# Patient Record
Sex: Female | Born: 1980 | Race: Black or African American | Hispanic: No | Marital: Single | Smoking: Never smoker
Health system: Southern US, Community
[De-identification: ages and names within clinical notes are randomized; demographics above are authoritative.]

## PROBLEM LIST (undated history)

## (undated) DIAGNOSIS — N858 Other specified noninflammatory disorders of uterus: Secondary | ICD-10-CM

## (undated) HISTORY — PX: HERNIA REPAIR: SHX51

---

## 2016-08-28 ENCOUNTER — Inpatient Hospital Stay (HOSPITAL_COMMUNITY)
Admission: AD | Admit: 2016-08-28 | Discharge: 2016-08-29 | Disposition: A | Discharge status: Home / Self Care | Payer: Self-pay | Source: Ambulatory Visit | Attending: Obstetrics and Gynecology | Admitting: Obstetrics and Gynecology

## 2016-08-28 ENCOUNTER — Encounter (HOSPITAL_COMMUNITY): Payer: Self-pay

## 2016-08-28 DIAGNOSIS — Z3202 Encounter for pregnancy test, result negative: Secondary | ICD-10-CM | POA: Insufficient documentation

## 2016-08-28 DIAGNOSIS — N946 Dysmenorrhea, unspecified: Secondary | ICD-10-CM | POA: Insufficient documentation

## 2016-08-28 DIAGNOSIS — N939 Abnormal uterine and vaginal bleeding, unspecified: Secondary | ICD-10-CM | POA: Insufficient documentation

## 2016-08-28 LAB — WET PREP, GENITAL
CLUE CELLS WET PREP: NONE SEEN
Sperm: NONE SEEN
Trich, Wet Prep: NONE SEEN
YEAST WET PREP: NONE SEEN

## 2016-08-28 LAB — CBC
HCT: 35.1 % — ABNORMAL LOW (ref 36.0–46.0)
Hemoglobin: 11.7 g/dL — ABNORMAL LOW (ref 12.0–15.0)
MCH: 29.8 pg (ref 26.0–34.0)
MCHC: 33.3 g/dL (ref 30.0–36.0)
MCV: 89.5 fL (ref 78.0–100.0)
PLATELETS: 215 10*3/uL (ref 150–400)
RBC: 3.92 MIL/uL (ref 3.87–5.11)
RDW: 13.7 % (ref 11.5–15.5)
WBC: 7.6 10*3/uL (ref 4.0–10.5)

## 2016-08-28 LAB — URINALYSIS, ROUTINE W REFLEX MICROSCOPIC
BACTERIA UA: NONE SEEN
Bilirubin Urine: NEGATIVE
Glucose, UA: NEGATIVE mg/dL
KETONES UR: NEGATIVE mg/dL
LEUKOCYTES UA: NEGATIVE
Nitrite: NEGATIVE
PH: 6 (ref 5.0–8.0)
Protein, ur: NEGATIVE mg/dL
Specific Gravity, Urine: 1.005 (ref 1.005–1.030)

## 2016-08-28 LAB — POCT PREGNANCY, URINE: PREG TEST UR: NEGATIVE

## 2016-08-28 MED ORDER — KETOROLAC TROMETHAMINE 60 MG/2ML IM SOLN
60.0000 mg | INTRAMUSCULAR | Status: AC
  Administered 2016-08-28: 60 mg via INTRAMUSCULAR
  Filled 2016-08-28: qty 2

## 2016-08-28 MED ORDER — MEGESTROL ACETATE 20 MG PO TABS: ORAL_TABLET | ORAL | 0 refills | Status: DC

## 2016-08-28 NOTE — MAU Note (Signed)
Patient reports lower abdominal pain and lower back pain.  No period for 3 months.  Bleeding beginning 2 days ago and reports passing a few medium sized blood clots.  Has not taken a HPT.  Reports feeling "like she is pregnant," reporting symptoms of nausea, palpitations, headaches,  lethargy & loss of appetite.  Last intercourse February 6th.

## 2016-08-28 NOTE — MAU Provider Note (Signed)
Chief Complaint: Possible Pregnancy; Vaginal Bleeding; and Abdominal Pain   First Provider Initiated Contact with Patient 08/28/16 2247      SUBJECTIVE HPI: Tiffany Daugherty is a 36 y.o. G2P1010 presents to maternity admissions reporting possible pregnancy with recent intermittent symptoms of fatigue, nausea, and h/a then onset of abdominal pain and vaginal bleeding 2 days ago. She has not taken a pregnancy test but reports last menses 3 months ago. With her symptoms and late menses, she believes she is pregnant.  She usually has regular menses.  Her pain started first with intermittent lower abdominal cramping, then onset of vaginal bleeding that was at first light, then heavier with clots the following day. She reports changing her pad every 2 hours today, soaking the pad with clots each time.  She has not tried any treatments. Nothing makes the pain or bleeding better.  She recently moved from [place] and thinks the move is making her fatigue and nausea worse.  There are no other associated symptoms. She denies vaginal itching/burning, urinary symptoms, h/a, dizziness, vomiting, or fever/chills.     HPI  Past Medical History:  Diagnosis Date  . Medical history non-contributory    Past Surgical History:  Procedure Laterality Date  . NO PAST SURGERIES     Social History   Social History  . Marital status: Single    Spouse name: N/A  . Number of children: N/A  . Years of education: N/A   Occupational History  . Not on file.   Social History Main Topics  . Smoking status: Not on file  . Smokeless tobacco: Not on file  . Alcohol use Not on file  . Drug use: Unknown  . Sexual activity: Not Currently   Other Topics Concern  . Not on file   Social History Narrative  . No narrative on file   No current facility-administered medications on file prior to encounter.    No current outpatient prescriptions on file prior to encounter.   No Known Allergies  ROS:  Review of Systems   Constitutional: Negative for chills, fatigue and fever.  Respiratory: Negative for shortness of breath.   Cardiovascular: Negative for chest pain.  Gastrointestinal: Positive for nausea. Negative for vomiting.  Genitourinary: Positive for pelvic pain and vaginal bleeding. Negative for difficulty urinating, dysuria, flank pain, vaginal discharge and vaginal pain.  Neurological: Negative for dizziness and headaches.  Psychiatric/Behavioral: Negative.      I have reviewed patient's Past Medical Hx, Surgical Hx, Family Hx, Social Hx, medications and allergies.   Physical Exam   Patient Vitals for the past 24 hrs:  BP Temp Temp src Pulse Resp Height  08/29/16 0007 115/69 - - 82 19 -  08/28/16 2229 124/84 98.1 F (36.7 C) Oral 89 19 5' 4.96" (1.65 m)   Constitutional: Well-developed, well-nourished female in no acute distress.  Cardiovascular: normal rate Respiratory: normal effort GI: Abd soft, non-tender. Pos BS x 4 MS: Extremities nontender, no edema, normal ROM Neurologic: Alert and oriented x 4.  GU: Neg CVAT.  PELVIC EXAM: Cervix pink, visually closed, without lesion, moderate amount dark red bleeding without clots, vaginal walls and external genitalia normal Bimanual exam: Cervix 0/long/high, firm, anterior, neg CMT, uterus tender, adnexa without tenderness, enlargement, or mass, technically difficult to palpate uterus and adnexa r/t body habitus   LAB RESULTS Results for orders placed or performed during the hospital encounter of 08/28/16 (from the past 24 hour(s))  Urinalysis, Routine w reflex microscopic     Status:  Abnormal   Collection Time: 08/28/16  9:50 PM  Result Value Ref Range   Color, Urine STRAW (A) YELLOW   APPearance CLEAR CLEAR   Specific Gravity, Urine 1.005 1.005 - 1.030   pH 6.0 5.0 - 8.0   Glucose, UA NEGATIVE NEGATIVE mg/dL   Hgb urine dipstick LARGE (A) NEGATIVE   Bilirubin Urine NEGATIVE NEGATIVE   Ketones, ur NEGATIVE NEGATIVE mg/dL   Protein,  ur NEGATIVE NEGATIVE mg/dL   Nitrite NEGATIVE NEGATIVE   Leukocytes, UA NEGATIVE NEGATIVE   RBC / HPF TOO NUMEROUS TO COUNT 0 - 5 RBC/hpf   WBC, UA 0-5 0 - 5 WBC/hpf   Bacteria, UA NONE SEEN NONE SEEN   Squamous Epithelial / LPF 0-5 (A) NONE SEEN  Pregnancy, urine POC     Status: None   Collection Time: 08/28/16  9:59 PM  Result Value Ref Range   Preg Test, Ur NEGATIVE NEGATIVE  Wet prep, genital     Status: Abnormal   Collection Time: 08/28/16 11:00 PM  Result Value Ref Range   Yeast Wet Prep HPF POC NONE SEEN NONE SEEN   Trich, Wet Prep NONE SEEN NONE SEEN   Clue Cells Wet Prep HPF POC NONE SEEN NONE SEEN   WBC, Wet Prep HPF POC FEW (A) NONE SEEN   Sperm NONE SEEN   CBC     Status: Abnormal   Collection Time: 08/28/16 11:13 PM  Result Value Ref Range   WBC 7.6 4.0 - 10.5 K/uL   RBC 3.92 3.87 - 5.11 MIL/uL   Hemoglobin 11.7 (L) 12.0 - 15.0 g/dL   HCT 35.1 (L) 36.0 - 46.0 %   MCV 89.5 78.0 - 100.0 fL   MCH 29.8 26.0 - 34.0 pg   MCHC 33.3 30.0 - 36.0 g/dL   RDW 13.7 11.5 - 15.5 %   Platelets 215 150 - 400 K/uL       IMAGING No results found.  MAU Management/MDM: Ordered labs (UPT, UA, CBC) and reviewed results.  Hgb low normal, pregnancy test negative.  Likely AUB associated with dysmenorrhea.  F/U with outpatient [place] and appointment in [facility].  Toradol 60 mg IM x 1 dose given in MAU.  Rx for Megace 40 mg daily to use if bleeding persists or remains heavy.  Pt stable at time of discharge.  ASSESSMENT 1. Abnormal uterine bleeding (AUB)     PLAN Discharge home with bleeding precautions  Allergies as of 08/29/2016   No Known Allergies     Medication List    TAKE these medications   megestrol 20 MG tablet Commonly known as:  MEGACE Take 4 tablets on Day 1, then 2 tablets/day for 2 weeks to 1 month. Prendre 4 comprims le jour 1, puis 2 comprims / jour pendant 2 semaines  1 mois.      Follow-up [facility] for [place] Follow up.    Specialty:  Obstetrics and Gynecology Why:  Marin Comment bureau vous appellera avec un rendez-vous. Retourner  MAU au besoin pour les urgences Contact information: [place] Certified Nurse-Midwife 08/29/2016  3:30 AM

## 2016-08-28 NOTE — Discharge Instructions (Signed)
Saignement utrin anormal Les saignements utrins anormaux peuvent affecter les femmes  diffrents stades de Mining engineer, y compris les adolescents, les femmes en ge de Dispensing optician, les femmes enceintes et les femmes mnopauses. Plusieurs types de saignements utrins sont considrs [employer], y compris: Saignement ou spotting entre les priodes. Saignement aprs un rapport sexuel. Saignement qui est plus lourd ou plus que la normale. Priodes qui durent plus longtemps que d'habitude. Saignement aprs la mnopause. De nombreux cas de saignements utrins anormaux sont mineurs et simples  traiter, tandis que [employer] plus graves. Tout type de saignement anormal devrait tre valu par votre fournisseur de soins de sant. Le traitement dpendra de la cause du saignement. Noreene Larsson ces instructions  la maison: Occupational psychologist votre condition pour [employer]. Les actions suivantes Architectural technologist toute gne que vous prouvez: vitez l'utilisation de tampons et douches comme indiqu par votre fournisseur de soins de sant. Changez vos pads frquemment. Vous devriez obtenir des examens pelviens rguliers et des tests Pap. Gardez tous les rendez-vous de suivi pour les tests de diagnostic comme indiqu par [employer] fournisseur de soins de sant. Contactez un fournisseur de soins de sant si: Votre saignement dure plus d'une semaine. Vous vous sentez tourdi par moments. Fae Pippin de l'aide immdiatement si: Vous vous vanouissez. Vous changez de serviettes toutes les 15  30 minutes. Vous avez des [employer]. Tu as de TEFL teacher. Vous devenez [facility]. Vous passez de gros caillots de sang du vagin. Vous commencez  vous sentir nauseux et vomissez. Cette information n'est pas destine  remplacer les conseils donns par votre fournisseur de soins de sant. Assurez-vous de [facility] questions que vous Art therapist votre fournisseur de soins de sant.   Abnormal Uterine  Bleeding Abnormal uterine bleeding can affect women at various stages in life, including teenagers, women in their reproductive years, pregnant women, and women who have reached menopause. Several kinds of uterine bleeding are considered abnormal, including:  Bleeding or spotting between periods.  Bleeding after sexual intercourse.  Bleeding that is heavier or more than normal.  Periods that last longer than usual.  Bleeding after menopause. Many cases of abnormal uterine bleeding are minor and simple to treat, while others are more serious. Any type of abnormal bleeding should be evaluated by your health care provider. Treatment will depend on the cause of the bleeding. Follow these instructions at home: Monitor your condition for any changes. The following actions may help to alleviate any discomfort you are experiencing:  Avoid the use of tampons and douches as directed by your health care provider.  Change your pads frequently. You should get regular pelvic exams and Pap tests. Keep all follow-up appointments for diagnostic tests as directed by your health care provider. Contact a health care provider if:  Your bleeding lasts more than 1 week.  You feel dizzy at times. Get help right away if:  You pass out.  You are changing pads every 15 to 30 minutes.  You have abdominal pain.  You have a fever.  You become sweaty or weak.  You are passing large blood clots from the vagina.  You start to feel nauseous and vomit. This information is not intended to replace advice given to you by your health care provider. Make sure you discuss any questions you have with your health care provider. Document Released: 04/30/2005 Document Revised: 10/12/2015 Document Reviewed: 11/27/2012 Elsevier Interactive Patient Education  2017 [employer].

## 2016-08-30 LAB — GC/CHLAMYDIA PROBE AMP (~~LOC~~) NOT AT ARMC
Chlamydia: NEGATIVE
NEISSERIA GONORRHEA: NEGATIVE

## 2016-09-14 ENCOUNTER — Ambulatory Visit (HOSPITAL_COMMUNITY)
Admission: RE | Admit: 2016-09-14 | Discharge: 2016-09-14 | Disposition: A | Discharge status: Home / Self Care | Payer: Self-pay | Source: Ambulatory Visit | Attending: Advanced Practice Midwife | Admitting: Advanced Practice Midwife

## 2016-09-14 DIAGNOSIS — D251 Intramural leiomyoma of uterus: Secondary | ICD-10-CM | POA: Insufficient documentation

## 2016-09-14 DIAGNOSIS — N939 Abnormal uterine and vaginal bleeding, unspecified: Secondary | ICD-10-CM | POA: Insufficient documentation

## 2016-10-02 ENCOUNTER — Encounter: Payer: Self-pay | Admitting: Obstetrics & Gynecology

## 2017-01-09 ENCOUNTER — Encounter (HOSPITAL_COMMUNITY): Payer: Self-pay | Admitting: Emergency Medicine

## 2017-01-09 ENCOUNTER — Ambulatory Visit (HOSPITAL_COMMUNITY)
Admission: EM | Admit: 2017-01-09 | Discharge: 2017-01-09 | Disposition: A | Discharge status: Home / Self Care | Payer: Self-pay | Attending: Family Medicine | Admitting: Family Medicine

## 2017-01-09 DIAGNOSIS — M7918 Myalgia, other site: Secondary | ICD-10-CM

## 2017-01-09 DIAGNOSIS — M791 Myalgia: Secondary | ICD-10-CM

## 2017-01-09 MED ORDER — DICLOFENAC SODIUM 75 MG PO TBEC: 75.0000 mg | DELAYED_RELEASE_TABLET | Freq: Two times a day (BID) | ORAL | 0 refills | Status: DC

## 2017-01-09 MED ORDER — CYCLOBENZAPRINE HCL 10 MG PO TABS: 10.0000 mg | ORAL_TABLET | Freq: Two times a day (BID) | ORAL | 0 refills | Status: DC | PRN
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
## 2017-07-29 ENCOUNTER — Ambulatory Visit (INDEPENDENT_AMBULATORY_CARE_PROVIDER_SITE_OTHER): Payer: Self-pay

## 2017-07-29 ENCOUNTER — Ambulatory Visit (HOSPITAL_COMMUNITY)
Admission: EM | Admit: 2017-07-29 | Discharge: 2017-07-29 | Disposition: A | Discharge status: Home / Self Care | Payer: Self-pay | Attending: Internal Medicine | Admitting: Internal Medicine

## 2017-07-29 ENCOUNTER — Encounter (HOSPITAL_COMMUNITY): Payer: Self-pay | Admitting: Emergency Medicine

## 2017-07-29 DIAGNOSIS — M545 Low back pain, unspecified: Secondary | ICD-10-CM

## 2017-07-29 DIAGNOSIS — G44209 Tension-type headache, unspecified, not intractable: Secondary | ICD-10-CM

## 2017-07-29 LAB — POCT URINALYSIS DIP (DEVICE)
Bilirubin Urine: NEGATIVE
GLUCOSE, UA: NEGATIVE mg/dL
Ketones, ur: NEGATIVE mg/dL
LEUKOCYTES UA: NEGATIVE
Nitrite: NEGATIVE
PROTEIN: NEGATIVE mg/dL
Specific Gravity, Urine: 1.025 (ref 1.005–1.030)
UROBILINOGEN UA: 0.2 mg/dL (ref 0.0–1.0)
pH: 5.5 (ref 5.0–8.0)

## 2017-07-29 MED ORDER — MELOXICAM 7.5 MG PO TABS: 7.5000 mg | ORAL_TABLET | Freq: Every day | ORAL | 0 refills | Status: DC

## 2017-07-29 MED ORDER — CYCLOBENZAPRINE HCL 10 MG PO TABS: 10.0000 mg | ORAL_TABLET | Freq: Every evening | ORAL | 0 refills | Status: DC | PRN

## 2017-07-29 NOTE — ED Triage Notes (Signed)
Pt here for frontal HA and lower back pain x 10 days; back worse with standing

## 2017-07-29 NOTE — ED Provider Notes (Signed)
  MRN: [id number] DOB: [birth year]  Subjective:   Tiffany Daugherty is a 37 y.o. female presenting for 10 day history of low back pain, R>L. Pain is cramp type sensation, does not radiate, feels warmth, has difficulty walking. Has also had a headache for the same amount. Headache is constant, frontal, improved with APAP, worse in the morning and at night. Has associated nausea without vomiting, occasional subjective fever. Denies falls, trauma, dysuria, urinary frequency, hematuria, sinus congestion, sinus pain, sore throat. Denies smoking cigarettes.  Tiffany Daugherty has No Known Allergies.  Tiffany Daugherty has pmh of external hemorrhoids, psh of hemorrhoidectomy.  Objective:   Vitals: BP (!) 146/91 (BP Location: Right Arm)   Pulse 87   Temp (!) 97.5 F (36.4 C) (Oral)   Resp 18   SpO2 100%   Physical Exam  Constitutional: She is oriented to person, place, and time. She appears well-developed and well-nourished.  HENT:  Mouth/Throat: Oropharynx is clear and moist.  Eyes: EOM are normal. Pupils are equal, round, and reactive to light. Right eye exhibits no discharge. Left eye exhibits no discharge.  Neck: Normal range of motion. Neck supple.  Cardiovascular: Normal rate, regular rhythm and intact distal pulses. Exam reveals no gallop and no friction rub.  No murmur heard. Pulmonary/Chest: Effort normal. No respiratory distress. She has no wheezes. She has no rales.  Neurological: She is alert and oriented to person, place, and time. She displays normal reflexes. Coordination (moving gingerly favoring her low back) abnormal.  Skin: Skin is warm and dry.  Psychiatric: She has a normal mood and affect.   Results for orders placed or performed during the hospital encounter of 07/29/17 (from the past 24 hour(s))  POCT urinalysis dip (device)     Status: Abnormal   Collection Time: 07/29/17 12:11 PM  Result Value Ref Range   Glucose, UA NEGATIVE NEGATIVE mg/dL   Bilirubin Urine NEGATIVE NEGATIVE   Ketones, ur  NEGATIVE NEGATIVE mg/dL   Specific Gravity, Urine 1.025 1.005 - 1.030   Hgb urine dipstick LARGE (A) NEGATIVE   pH 5.5 5.0 - 8.0   Protein, ur NEGATIVE NEGATIVE mg/dL   Urobilinogen, UA 0.2 0.0 - 1.0 mg/dL   Nitrite NEGATIVE NEGATIVE   Leukocytes, UA NEGATIVE NEGATIVE   Dg Lumbar Spine Complete  Result Date: 07/29/2017 CLINICAL DATA:  Two weeks of low back pain.  No known injury EXAM: LUMBAR SPINE - COMPLETE 4+ VIEW COMPARISON:  None in PACs FINDINGS: The lumbar vertebral bodies are preserved in height. The disc space heights are well maintained. There is no spondylolisthesis. There is no significant facet joint hypertrophy. The pedicles and transverse processes are intact. The observed portions of the sacrum are normal. IMPRESSION: There is no acute or significant chronic bony abnormality of the lumbar spine. Who hole interest Electronically Signed   By: David  Martinique M.D.   On: 07/29/2017 12:29   Assessment and Plan :   Acute non intractable tension-type headache  Acute low back pain without sciatica, unspecified back pain laterality  Physical exam findings reassuring. Will start conservative management. Use meloxicam, hydrate well. Return-to-clinic precautions discussed, patient verbalized understanding.    Jaynee Eagles, PA-C 07/29/17 2257

## 2017-09-26 ENCOUNTER — Ambulatory Visit: Payer: Self-pay | Admitting: Obstetrics & Gynecology

## 2017-09-26 ENCOUNTER — Telehealth: Payer: Self-pay | Admitting: *Deleted

## 2017-09-26 ENCOUNTER — Other Ambulatory Visit: Payer: Self-pay

## 2017-09-26 ENCOUNTER — Encounter: Payer: Self-pay | Admitting: Obstetrics & Gynecology

## 2017-09-26 VITALS — BP 147/81 | HR 90 | Wt 299.6 lb

## 2017-09-26 DIAGNOSIS — Z3202 Encounter for pregnancy test, result negative: Secondary | ICD-10-CM

## 2017-09-26 DIAGNOSIS — N912 Amenorrhea, unspecified: Secondary | ICD-10-CM

## 2017-09-26 LAB — POCT PREGNANCY, URINE: Preg Test, Ur: NEGATIVE

## 2017-09-26 NOTE — Telephone Encounter (Signed)
Called pt's sister at the pt's request. Left message on voice mail. U/s appt scheduled 5/21 @ 0930. Please arrive 15 min early and have a full bladder. If any questions she may call the clinic.

## 2017-09-26 NOTE — Progress Notes (Signed)
[place] interpreter [phone]

## 2017-09-26 NOTE — Progress Notes (Signed)
Patient ID: Tiffany Daugherty, female   DOB: [birth year], 37 y.o.   MRN: [id number]  Chief Complaint  Patient presents with  . Amenorrhea    HPI Tiffany Daugherty is a 37 y.o. female.  Single African P1 (71 yo son) here today because she has not had a period in a year. She denies hot flashes. She had sex last about 2 months ago. She uses condoms.  HPI  Past Medical History:  Diagnosis Date  . Medical history non-contributory     Past Surgical History:  Procedure Laterality Date  . NO PAST SURGERIES      History reviewed. No pertinent family history.  Social History Social History   Tobacco Use  . Smoking status: Never Smoker  . Smokeless tobacco: Never Used  Substance Use Topics  . Alcohol use: Yes  . Drug use: No    No Known Allergies  Current Outpatient Medications  Medication Sig Dispense Refill  . cyclobenzaprine (FLEXERIL) 10 MG tablet Take 1 tablet (10 mg total) by mouth at bedtime as needed for muscle spasms. (Patient not taking: Reported on 09/26/2017) 30 tablet 0  . meloxicam (MOBIC) 7.5 MG tablet Take 1 tablet (7.5 mg total) by mouth daily. (Patient not taking: Reported on 09/26/2017) 30 tablet 0   No current facility-administered medications for this visit.     Review of Systems Review of Systems  Blood pressure (!) 147/81, pulse 90, weight 299 lb 9.6 oz (135.9 kg), last menstrual period 08/27/2016.  Physical Exam Physical Exam Breathing, conversing, and ambulating normally Morbidly obese Black female, no apparent distress  Data Reviewed Normal u/s 5/18  Assessment    Preventative care   Amenorrhea Plan    She was given the number for the [facility]. Check TSH, FSH, prolactin, and gyn u/s       Emily Filbert 09/26/2017, 9:43 AM

## 2017-09-27 LAB — TSH: TSH: 4.07 u[IU]/mL (ref 0.450–4.500)

## 2017-09-27 LAB — PROLACTIN: Prolactin: 12.5 ng/mL (ref 4.8–23.3)

## 2017-09-27 LAB — FOLLICLE STIMULATING HORMONE: FSH: 4.7 m[IU]/mL

## 2017-10-01 ENCOUNTER — Ambulatory Visit (HOSPITAL_COMMUNITY): Admission: RE | Admit: 2017-10-01 | Payer: Self-pay | Source: Ambulatory Visit

## 2017-10-10 ENCOUNTER — Other Ambulatory Visit: Payer: Self-pay | Admitting: Obstetrics & Gynecology

## 2017-10-16 ENCOUNTER — Encounter (HOSPITAL_COMMUNITY): Payer: Self-pay

## 2017-10-16 ENCOUNTER — Inpatient Hospital Stay (HOSPITAL_COMMUNITY)
Admission: AD | Admit: 2017-10-16 | Discharge: 2017-10-16 | Disposition: A | Discharge status: Home / Self Care | Payer: Self-pay | Source: Ambulatory Visit | Attending: Obstetrics & Gynecology | Admitting: Obstetrics & Gynecology

## 2017-10-16 ENCOUNTER — Ambulatory Visit (HOSPITAL_COMMUNITY): Admission: RE | Admit: 2017-10-16 | Payer: Self-pay | Source: Ambulatory Visit

## 2017-10-16 ENCOUNTER — Inpatient Hospital Stay (HOSPITAL_COMMUNITY): Payer: Self-pay

## 2017-10-16 ENCOUNTER — Other Ambulatory Visit: Payer: Self-pay

## 2017-10-16 DIAGNOSIS — N730 Acute parametritis and pelvic cellulitis: Secondary | ICD-10-CM

## 2017-10-16 DIAGNOSIS — R102 Pelvic and perineal pain: Secondary | ICD-10-CM

## 2017-10-16 HISTORY — DX: Other specified noninflammatory disorders of uterus: N85.8

## 2017-10-16 LAB — CBC WITH DIFFERENTIAL/PLATELET
Basophils Absolute: 0 10*3/uL (ref 0.0–0.1)
Basophils Relative: 0 %
Eosinophils Absolute: 0.1 10*3/uL (ref 0.0–0.7)
Eosinophils Relative: 1 %
HCT: 36.3 % (ref 36.0–46.0)
HEMOGLOBIN: 12.4 g/dL (ref 12.0–15.0)
LYMPHS ABS: 2.4 10*3/uL (ref 0.7–4.0)
LYMPHS PCT: 42 %
MCH: 30 pg (ref 26.0–34.0)
MCHC: 34.2 g/dL (ref 30.0–36.0)
MCV: 87.9 fL (ref 78.0–100.0)
Monocytes Absolute: 0.2 10*3/uL (ref 0.1–1.0)
Monocytes Relative: 4 %
NEUTROS ABS: 3 10*3/uL (ref 1.7–7.7)
NEUTROS PCT: 53 %
Platelets: 211 10*3/uL (ref 150–400)
RBC: 4.13 MIL/uL (ref 3.87–5.11)
RDW: 14.1 % (ref 11.5–15.5)
WBC: 5.7 10*3/uL (ref 4.0–10.5)

## 2017-10-16 LAB — COMPREHENSIVE METABOLIC PANEL
ALK PHOS: 58 U/L (ref 38–126)
ALT: 21 U/L (ref 14–54)
AST: 19 U/L (ref 15–41)
Albumin: 3.8 g/dL (ref 3.5–5.0)
Anion gap: 10 (ref 5–15)
BUN: 9 mg/dL (ref 6–20)
CALCIUM: 8.8 mg/dL — AB (ref 8.9–10.3)
CO2: 24 mmol/L (ref 22–32)
CREATININE: 0.88 mg/dL (ref 0.44–1.00)
Chloride: 103 mmol/L (ref 101–111)
GFR calc Af Amer: >60 mL/min (ref >60)
GFR calc non Af Amer: >60 mL/min (ref >60)
GLUCOSE: 109 mg/dL — AB (ref 65–99)
Potassium: 4 mmol/L (ref 3.5–5.1)
Sodium: 137 mmol/L (ref 135–145)
Total Bilirubin: 0.4 mg/dL (ref 0.3–1.2)
Total Protein: 8 g/dL (ref 6.5–8.1)

## 2017-10-16 LAB — POCT PREGNANCY, URINE: Preg Test, Ur: NEGATIVE

## 2017-10-16 LAB — URINALYSIS, ROUTINE W REFLEX MICROSCOPIC
Bilirubin Urine: NEGATIVE
Glucose, UA: NEGATIVE mg/dL
Hgb urine dipstick: NEGATIVE
Ketones, ur: NEGATIVE mg/dL
LEUKOCYTES UA: NEGATIVE
NITRITE: NEGATIVE
Protein, ur: NEGATIVE mg/dL
SPECIFIC GRAVITY, URINE: 1.025 (ref 1.005–1.030)
pH: 7 (ref 5.0–8.0)

## 2017-10-16 LAB — WET PREP, GENITAL
CLUE CELLS WET PREP: NONE SEEN
SPERM: NONE SEEN
TRICH WET PREP: NONE SEEN

## 2017-10-16 LAB — AMYLASE: Amylase: 97 U/L (ref 28–100)

## 2017-10-16 LAB — LIPASE, BLOOD: LIPASE: 25 U/L (ref 11–51)

## 2017-10-16 MED ORDER — TRAMADOL HCL 50 MG PO TABS: 100.0000 mg | ORAL_TABLET | Freq: Four times a day (QID) | ORAL | 0 refills | Status: DC | PRN

## 2017-10-16 MED ORDER — AZITHROMYCIN 250 MG PO TABS: 1000.0000 mg | ORAL_TABLET | Freq: Once | ORAL | 0 refills | Status: AC

## 2017-10-16 MED ORDER — KETOROLAC TROMETHAMINE 60 MG/2ML IM SOLN
60.0000 mg | Freq: Once | INTRAMUSCULAR | Status: AC
Start: 2017-10-16 — End: 2017-10-16
  Administered 2017-10-16: 60 mg via INTRAMUSCULAR
  Filled 2017-10-16: qty 2

## 2017-10-16 MED ORDER — CEFTRIAXONE SODIUM 250 MG IJ SOLR
250.0000 mg | Freq: Once | INTRAMUSCULAR | Status: AC
  Administered 2017-10-16: 250 mg via INTRAMUSCULAR
  Filled 2017-10-16: qty 250

## 2017-10-16 MED ORDER — AZITHROMYCIN 250 MG PO TABS
1000.0000 mg | ORAL_TABLET | Freq: Once | ORAL | Status: AC
  Administered 2017-10-16: 1000 mg via ORAL
  Filled 2017-10-16: qty 4

## 2017-10-16 NOTE — Progress Notes (Addendum)
Use of stratus to communicate with pt. # [id number] Michaelle  Non pregn here for abd pain on the right side for past few days and getting worse especially last night. Took french medication (anti-inflammatory). States helped "one hour only". Had this same pain a year ago and was given pain med that helped.   States pain exacerbates during monthly menses.   Pt has an U/S appt at 1300 today.   Encouraged pt to obtain a primary doctor to manage her ongoing abd pain. Pt verbalized understanding.   1210: provider at bs assessing. Pelvic exam, wetprep, and GC done.   1222: Medicated per order.   1230: Lab at bs  1450: provider at bs reassessing with use of stratus. Betty [id number]  1500: medicated per order.   1509: D/c instructions given with pt to follow up with Dr. Hulan Fray.

## 2017-10-16 NOTE — Discharge Instructions (Signed)
Pelvic Pain, Female °Pelvic pain is pain in your lower belly (abdomen), below your belly button and between your hips. The pain may start suddenly (acute), keep coming back (recurring), or last a long time (chronic). Pelvic pain that lasts longer than six months is considered chronic. There are many causes of pelvic pain. Sometimes the cause of your pelvic pain is not known. °Follow these instructions at home: °· Take over-the-counter and prescription medicines only as told by your doctor. °· Rest as told by your doctor. °· Do not have sex it if hurts. °· Keep a journal of your pelvic pain. Write down: °? When the pain started. °? Where the pain is located. °? What seems to make the pain better or worse, such as food or your menstrual cycle. °? Any symptoms you have along with the pain. °· Keep all follow-up visits as told by your doctor. This is important. °Contact a doctor if: °· Medicine does not help your pain. °· Your pain comes back. °· You have new symptoms. °· You have unusual vaginal discharge or bleeding. °· You have a fever or chills. °· You are having a hard time pooping (constipation). °· You have blood in your pee (urine) or poop (stool). °· Your pee smells bad. °· You feel weak or lightheaded. °Get help right away if: °· You have sudden pain that is very bad. °· Your pain continues to get worse. °· You have very bad pain and also have any of the following symptoms: °? A fever. °? Feeling stick to your stomach (nausea). °? Throwing up (vomiting). °? Being very sweaty. °· You pass out (lose consciousness). °This information is not intended to replace advice given to you by your health care provider. Make sure you discuss any questions you have with your health care provider. °Document Released: 10/17/2007 Document Revised: 05/25/2015 Document Reviewed: 02/18/2015 °Elsevier Interactive Patient Education © 2018 Elsevier Inc. ° °

## 2017-10-16 NOTE — MAU Provider Note (Signed)
History     CSN: [id number]  Arrival date and time: 10/16/17 1032   First Provider Initiated Contact with Patient 10/16/17 1202     [facility] #[id number]  Chief Complaint  Patient presents with  . Pelvic Pain   Tiffany Daugherty is a 37 y.o. G2P1011 who presents today with pelvic pain. She reports that has been an ongoing issue with her. She is scheduled for a pelvic US today, but the pain was too great. She came here to be seen.   Pelvic Pain  The patient's primary symptoms include pelvic pain. The patient's pertinent negatives include no vaginal discharge. This is a new problem. Episode onset: 10/14/17. The problem occurs constantly. The problem has been gradually worsening. Pain severity now: 10/10. The problem affects both sides. She is not pregnant. Pertinent negatives include no chills, dysuria, fever, frequency, nausea, urgency or vomiting. The vaginal discharge was normal. There has been no bleeding. Nothing aggravates the symptoms. Treatments tried: diclofenac  The treatment provided no relief. She is not sexually active. She uses nothing for contraception. Her menstrual history has been irregular (LMP, 08/27/2016 has seen clinic for amenorrhea).   Past Medical History:  Diagnosis Date  . Uterine cyst     Past Surgical History:  Procedure Laterality Date  . HERNIA REPAIR      No family history on file.  Social History   Tobacco Use  . Smoking status: Never Smoker  . Smokeless tobacco: Never Used  Substance Use Topics  . Alcohol use: Yes  . Drug use: No    Allergies: No Known Allergies  Medications Prior to Admission  Medication Sig Dispense Refill Last Dose  . cyclobenzaprine (FLEXERIL) 10 MG tablet Take 1 tablet (10 mg total) by mouth at bedtime as needed for muscle spasms. (Patient not taking: Reported on 09/26/2017) 30 tablet 0 Not Taking  . meloxicam (MOBIC) 7.5 MG tablet Take 1 tablet (7.5 mg total) by mouth daily. (Patient not taking: Reported on 09/26/2017) 30  tablet 0 Not Taking    Review of Systems  Constitutional: Negative for chills and fever.  Gastrointestinal: Negative for nausea and vomiting.  Genitourinary: Positive for pelvic pain. Negative for dysuria, frequency, urgency, vaginal bleeding and vaginal discharge.   Physical Exam   Blood pressure 122/86, pulse 81, temperature 98.6 F (37 C), temperature source Oral, resp. rate 20, height 5\' 4"  (1.626 m), weight (!) 304 lb (137.9 kg), SpO2 98 %.  Physical Exam  Nursing note and vitals reviewed. Constitutional: She is oriented to person, place, and time. She appears well-developed. No distress.  HENT:  Head: Normocephalic.  Cardiovascular: Normal rate.  Respiratory: Effort normal.  GI: Soft. There is no tenderness. There is no rebound.  Genitourinary:  Genitourinary Comments:  External: no lesion Vagina: small amount of white discharge Cervix: pink, smooth, + CMT Uterus: NSSC Adnexa: NT   Neurological: She is alert and oriented to person, place, and time.  Skin: Skin is warm and dry.  Psychiatric: She has a normal mood and affect.     Results for orders placed or performed during the hospital encounter of 10/16/17 (from the past 24 hour(s))  Urinalysis, Routine w reflex microscopic     Status: None   Collection Time: 10/16/17 11:06 AM  Result Value Ref Range   Color, Urine YELLOW YELLOW   APPearance CLEAR CLEAR   Specific Gravity, Urine 1.025 1.005 - 1.030   pH 7.0 5.0 - 8.0   Glucose, UA NEGATIVE NEGATIVE mg/dL   Hgb  urine dipstick NEGATIVE NEGATIVE   Bilirubin Urine NEGATIVE NEGATIVE   Ketones, ur NEGATIVE NEGATIVE mg/dL   Protein, ur NEGATIVE NEGATIVE mg/dL   Nitrite NEGATIVE NEGATIVE   Leukocytes, UA NEGATIVE NEGATIVE  Pregnancy, urine POC     Status: None   Collection Time: 10/16/17 11:15 AM  Result Value Ref Range   Preg Test, Ur NEGATIVE NEGATIVE  Wet prep, genital     Status: Abnormal   Collection Time: 10/16/17 12:03 PM  Result Value Ref Range   Yeast  Wet Prep HPF POC PRESENT (A) NONE SEEN   Trich, Wet Prep NONE SEEN NONE SEEN   Clue Cells Wet Prep HPF POC NONE SEEN NONE SEEN   WBC, Wet Prep HPF POC FEW (A) NONE SEEN   Sperm NONE SEEN   CBC with Differential/Platelet     Status: None   Collection Time: 10/16/17 12:26 PM  Result Value Ref Range   WBC 5.7 4.0 - 10.5 K/uL   RBC 4.13 3.87 - 5.11 MIL/uL   Hemoglobin 12.4 12.0 - 15.0 g/dL   HCT 36.3 36.0 - 46.0 %   MCV 87.9 78.0 - 100.0 fL   MCH 30.0 26.0 - 34.0 pg   MCHC 34.2 30.0 - 36.0 g/dL   RDW 14.1 11.5 - 15.5 %   Platelets 211 150 - 400 K/uL   Neutrophils Relative % 53 %   Neutro Abs 3.0 1.7 - 7.7 K/uL   Lymphocytes Relative 42 %   Lymphs Abs 2.4 0.7 - 4.0 K/uL   Monocytes Relative 4 %   Monocytes Absolute 0.2 0.1 - 1.0 K/uL   Eosinophils Relative 1 %   Eosinophils Absolute 0.1 0.0 - 0.7 K/uL   Basophils Relative 0 %   Basophils Absolute 0.0 0.0 - 0.1 K/uL  Comprehensive metabolic panel     Status: Abnormal   Collection Time: 10/16/17 12:26 PM  Result Value Ref Range   Sodium 137 135 - 145 mmol/L   Potassium 4.0 3.5 - 5.1 mmol/L   Chloride 103 101 - 111 mmol/L   CO2 24 22 - 32 mmol/L   Glucose, Bld 109 (H) 65 - 99 mg/dL   BUN 9 6 - 20 mg/dL   Creatinine, Ser 0.88 0.44 - 1.00 mg/dL   Calcium 8.8 (L) 8.9 - 10.3 mg/dL   Total Protein 8.0 6.5 - 8.1 g/dL   Albumin 3.8 3.5 - 5.0 g/dL   AST 19 15 - 41 U/L   ALT 21 14 - 54 U/L   Alkaline Phosphatase 58 38 - 126 U/L   Total Bilirubin 0.4 0.3 - 1.2 mg/dL   GFR calc non Af Amer >60 >60 mL/min   GFR calc Af Amer >60 >60 mL/min   Anion gap 10 5 - 15  Amylase     Status: None   Collection Time: 10/16/17 12:26 PM  Result Value Ref Range   Amylase 97 28 - 100 U/L  Lipase, blood     Status: None   Collection Time: 10/16/17 12:26 PM  Result Value Ref Range   Lipase 25 11 - 51 U/L   US Pelvic Complete W Transvaginal And Torsion R/o  Result Date: 10/16/2017 CLINICAL DATA:  Pelvic pain EXAM: TRANSABDOMINAL AND TRANSVAGINAL  ULTRASOUND OF PELVIS TECHNIQUE: Both transabdominal and transvaginal ultrasound examinations of the pelvis were performed. Transabdominal technique was performed for global imaging of the pelvis including uterus, ovaries, adnexal regions, and pelvic cul-de-sac. It was necessary to proceed with endovaginal exam following the transabdominal exam  to visualize the endometrium. COMPARISON:  09/14/2016 FINDINGS: Uterus Measurements: 12.9 x 6.6 x 7.1 cm. 4 cm anterior left fundal fibroid. 1.2 cm anterior left body fibroid. Endometrium Thickness: Normal thickness, 10 mm. No focal abnormality visualized. Right ovary Measurements: 3.7 x 3.0 x 3.7 cm. Normal appearance/no adnexal mass. Left ovary Measurements: 3.2 x 3.1 x 3.0 cm. Normal appearance/no adnexal mass. Other findings No abnormal free fluid. IMPRESSION: Fibroid uterus. No acute findings. Electronically Signed   By: Rolm Baptise M.D.   On: 10/16/2017 13:58   MAU Course  Procedures  MDM Due to pelvic pain and +CMT will treat for presumed PID today. 250mg  rocphin and 1g Azithromycin given here. Will have patient take 1g Azithromycin in one week. Will DC home with pain medication   Assessment and Plan   1. PID (acute pelvic inflammatory disease)   2. Pelvic pain    DC home Comfort measures reviewed  RX: azithromycin 1g to take in one week. Tramadol PRN for pain #20  Return to MAU as needed FU with OB as planned  [facility] for [place] Follow up.   Specialty:  Obstetrics and Gynecology Contact information: [place] 10/16/2017, 12:04 PM

## 2017-10-16 NOTE — MAU Note (Signed)
Hurting a lot, started 3 days ago.  Worse last night,  Can not take this.  Thinks it is her right fallopian tube to her back, can not tolerate the pain.  Hx of pain like this, had this last year in [place].  Went to GYN, did an [place], xray- did not see anything, was given medicine for pain, helped.  Had been a year since she had it.  lmp was close to a year ago, was given some injection- had some d/c white then reddish.  Has appt at 1300 for [place] today.  Pain was so bad - she came to hosp early.

## 2017-10-17 LAB — GC/CHLAMYDIA PROBE AMP (~~LOC~~) NOT AT ARMC
Chlamydia: NEGATIVE
NEISSERIA GONORRHEA: NEGATIVE

## 2017-11-11 ENCOUNTER — Encounter: Payer: Self-pay | Admitting: Obstetrics & Gynecology

## 2017-11-11 ENCOUNTER — Ambulatory Visit: Payer: Self-pay | Admitting: Obstetrics & Gynecology

## 2017-11-11 VITALS — BP 123/85 | HR 81 | Ht 64.96 in | Wt 301.0 lb

## 2017-11-11 DIAGNOSIS — N912 Amenorrhea, unspecified: Secondary | ICD-10-CM

## 2017-11-11 MED ORDER — MEDROXYPROGESTERONE ACETATE 10 MG PO TABS: 10.0000 mg | ORAL_TABLET | Freq: Every day | ORAL | 12 refills | Status: DC

## 2017-11-11 NOTE — Progress Notes (Signed)
   Subjective:    Patient ID: Tiffany Daugherty, female    DOB: [birth year], 37 y.o.   MRN: [id number]  HPI Tiffany Daugherty is a 37 y.o. female.  Single African P1 (93 yo son) here today because she has not had a period in a year. She denies hot flashes. She had sex last about 2 months ago. She uses condoms. I saw her 5/19 and ordered a work up. Her FSH and TSH were normal. Her u/s showed some fibroids and a 9 mm endometrial lining.  She was seen in the MAU about a month ago with severe pelvic pain. She was treated for PID. Her cultures came back negative later. She reports that her pain has resolved.   Review of Systems     Objective:   Physical Exam Breathing, conversing, and ambulating normally Morbidly obese well hydrated Black female, no apparent distress Video interpretor used for exam     Assessment & Plan:  Pelvic pain- resolved Amenorrhea- work up negative Rec weight loss and cyclic provera to induce monthly bleeding Come back 4 months

## 2018-01-25 IMAGING — US US PELVIS COMPLETE
1 series · 15 of 25 positions shown · non-contrast
Comparison: None

CLINICAL DATA: Abnormal uterine bleeding and dysmenorrhea for 4
months.

EXAM:
TRANSABDOMINAL AND TRANSVAGINAL ULTRASOUND OF PELVIS
TECHNIQUE: Both transabdominal and transvaginal ultrasound examinations of the
pelvis were performed. Transabdominal technique was performed for
global imaging of the pelvis including uterus, ovaries, adnexal
regions, and pelvic cul-de-sac. It was necessary to proceed with
endovaginal exam following the transabdominal exam to visualize the
endometrium and ovaries.

[Series 1: us pelvis complete · 15 of 64 slices shown]
[im 1/64]
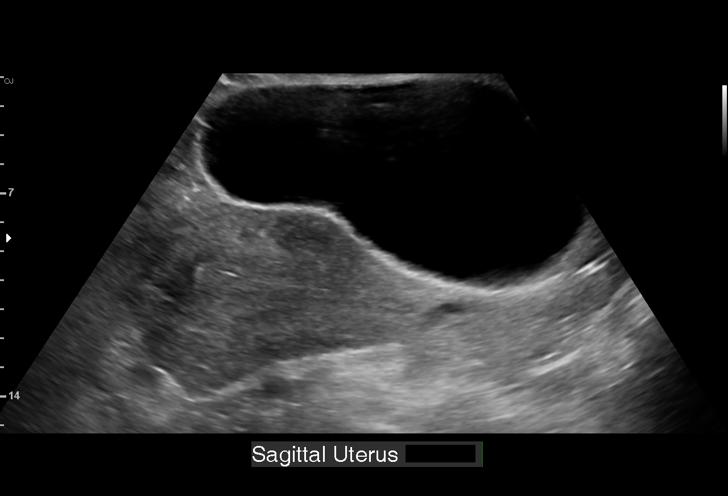
[im 6/64]
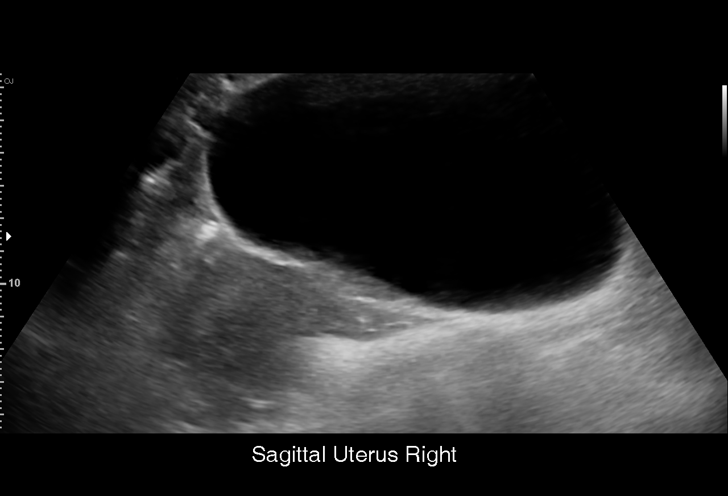
[im 11/64]
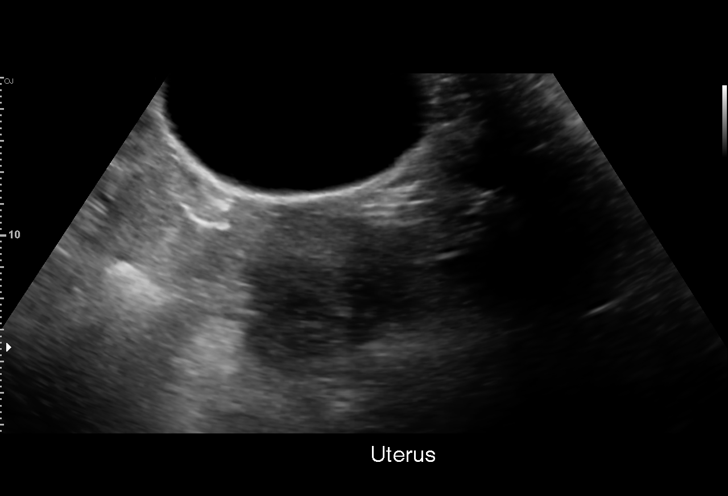
[im 14/64]
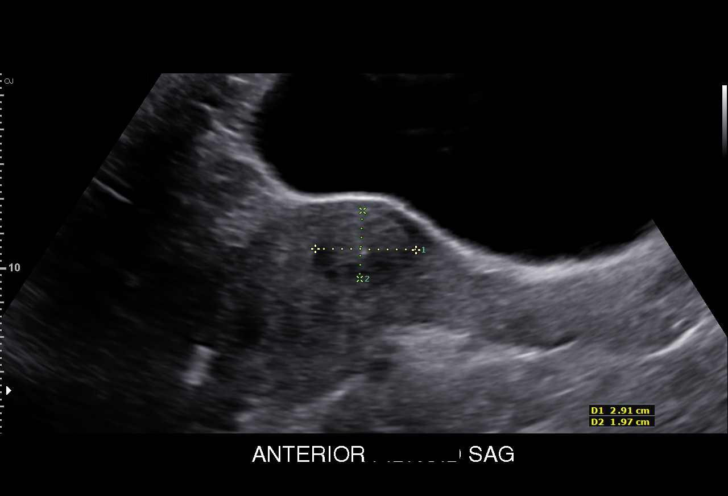
[im 19/64]
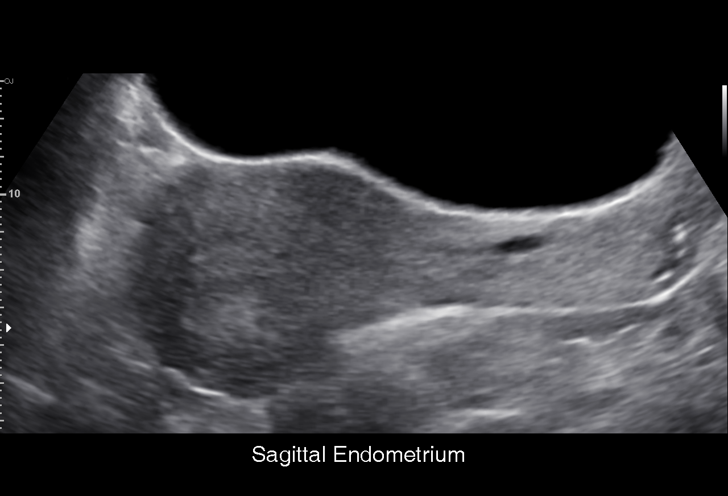
[im 24/64]
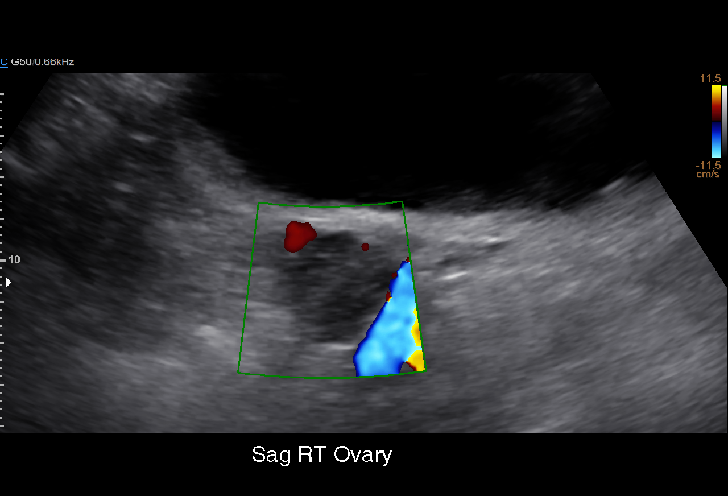
[im 27/64]
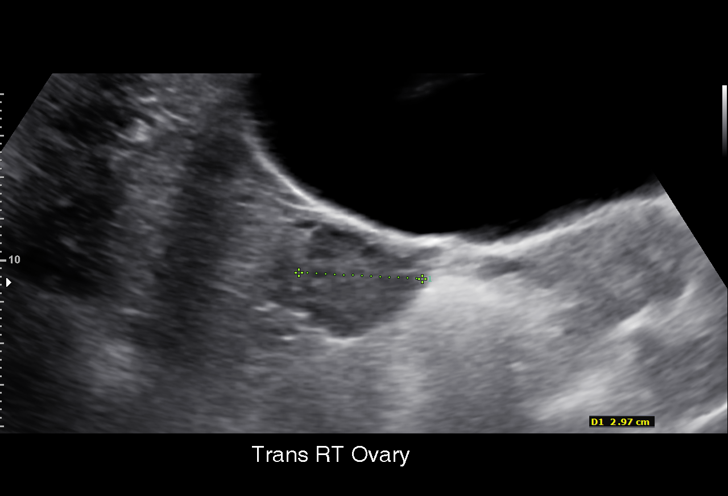
[im 32/64]
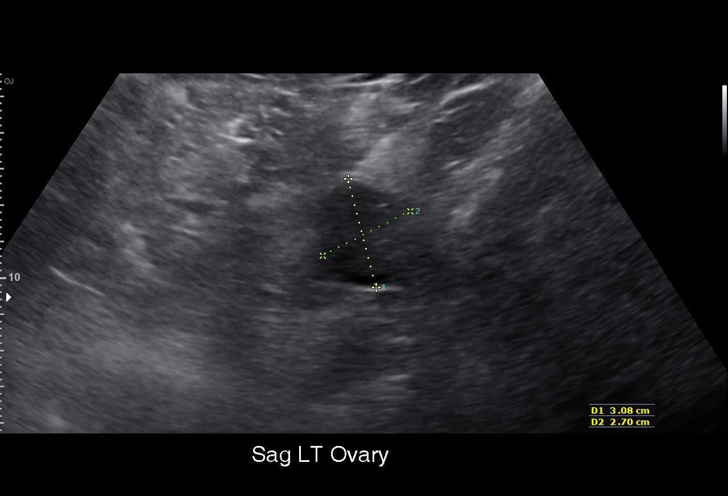
[im 37/64]
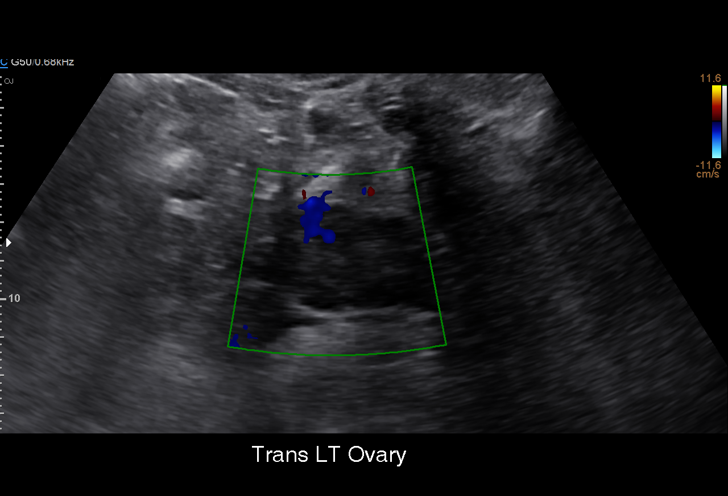
[im 40/64]
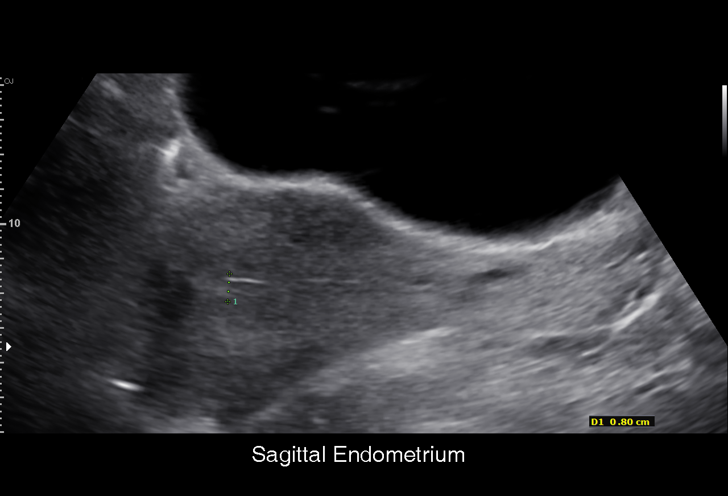
[im 45/64]
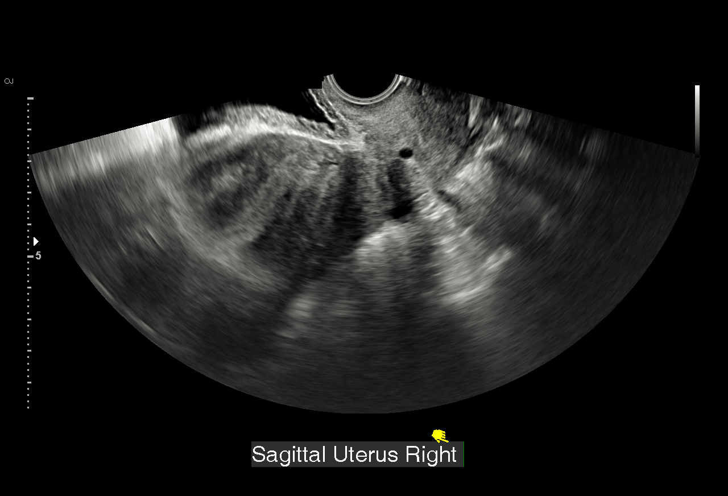
[im 50/64]
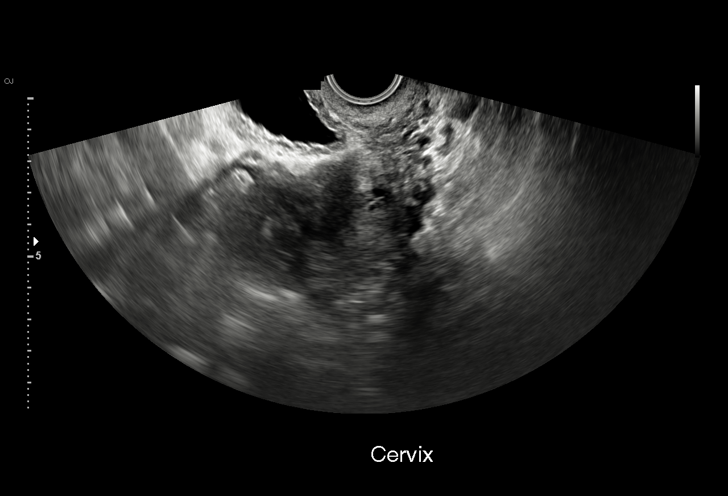
[im 53/64]
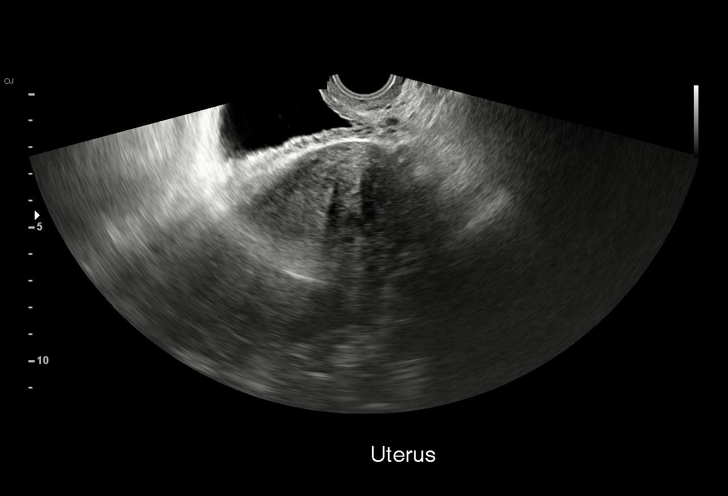
[im 58/64]
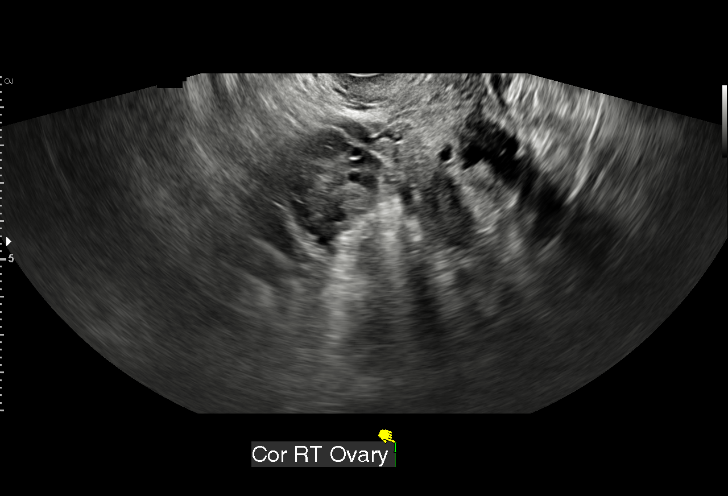
[im 64/64]
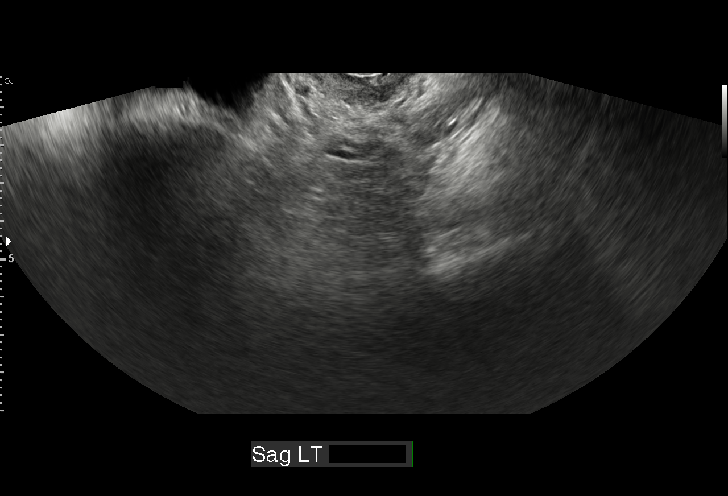

[15 of 25 positions shown; findings below may reference images not displayed]

FINDINGS: Uterus

Measurements: 14.1 x 6.6 x 7.6 cm. An intramural fibroid is seen in
the left anterior corpus measuring 3.8 cm. No other fibroids are
identified.

Endometrium

Thickness: 9 mm.  No focal abnormality visualized.

Right ovary

Measurements: 3.0 x 3.8 x 2.9 cm. Normal appearance/no adnexal mass.

Left ovary

Measurements: 3.1 x 2.7 x 2.9 cm. Normal appearance/no adnexal mass.

Other findings

No abnormal free fluid.
IMPRESSION: 3.8 cm intramural fibroid in left anterior corpus.

Endometrial thickness measures 9 mm. If bleeding remains
unresponsive to hormonal or medical therapy, sonohysterogram should
be considered for focal lesion work-up. (Ref: Radiological
Reasoning: Algorithmic Workup of Abnormal Vaginal Bleeding with
Endovaginal Sonography and Sonohysterography. AJR 3991; 191:S68-73).

Normal appearance of both ovaries.  No adnexal mass identified.

## 2018-03-12 ENCOUNTER — Ambulatory Visit: Payer: Self-pay | Admitting: Obstetrics & Gynecology

## 2018-03-12 ENCOUNTER — Telehealth: Payer: Self-pay | Admitting: Obstetrics & Gynecology

## 2018-03-12 NOTE — Telephone Encounter (Signed)
Called patient to inform her Dr Hulan Fray had an emergency, and we needed to get her appointment rescheduled. Left a voicemail.

## 2018-03-21 ENCOUNTER — Ambulatory Visit (INDEPENDENT_AMBULATORY_CARE_PROVIDER_SITE_OTHER): Payer: Self-pay | Admitting: Obstetrics & Gynecology

## 2018-03-21 ENCOUNTER — Encounter: Payer: Self-pay | Admitting: Obstetrics & Gynecology

## 2018-03-21 VITALS — BP 132/91 | HR 80 | Wt 303.1 lb

## 2018-03-21 DIAGNOSIS — D251 Intramural leiomyoma of uterus: Secondary | ICD-10-CM

## 2018-03-21 DIAGNOSIS — N912 Amenorrhea, unspecified: Secondary | ICD-10-CM

## 2018-03-21 MED ORDER — NAPROXEN 500 MG PO TABS: 500.0000 mg | ORAL_TABLET | Freq: Two times a day (BID) | ORAL | 2 refills | Status: DC

## 2018-03-21 MED ORDER — MEDROXYPROGESTERONE ACETATE 10 MG PO TABS: 10.0000 mg | ORAL_TABLET | Freq: Every day | ORAL | 12 refills | Status: DC

## 2018-03-21 NOTE — Patient Instructions (Addendum)
Tiffany Daugherty, [place] [address] Main: [phone]   Same-Day & [facility] has same-day and walk-in appointments available for established patients only. Walk-in appointments are available on Wednesday and Thursday from 8:30 am to 11:00 AM and from 1:30 AM to 4:00 PM. Same-day appointments are available daily from 8:30 AM to 4:15 PM.

## 2018-03-21 NOTE — Progress Notes (Signed)
   GYNECOLOGY OFFICE VISIT NOTE  History:  37 y.o. G2P1011 here today for follow up for management of amenorrhea.  Due to language barrier, a physical [place] interpreter (Amina) was present during the history-taking and subsequent discussion (and for part of the physical exam) with this patient. She still has no periods after she took prescribed monthly for 2 months, did not know she was supposed to continue.  Also reports intermittent pelvic pain attributed to chronic 4 cm intramural fibroid.  She denies any abnormal vaginal discharge, bleeding or other concerns.   Past Medical History:  Diagnosis Date  . Uterine cyst     Past Surgical History:  Procedure Laterality Date  . HERNIA REPAIR      The following portions of the patient's history were reviewed and updated as appropriate: allergies, current medications, past family history, past medical history, past social history, past surgical history and problem list.   Health Maintenance:  Cannot remember when she had pap smear last.  Review of Systems:  Pertinent items noted in HPI and remainder of comprehensive ROS otherwise negative.  Objective:  Physical Exam BP (!) 132/91   Pulse 80   Wt (!) 303 lb 1.6 oz (137.5 kg)   LMP 02/27/2018   BMI 50.50 kg/m  CONSTITUTIONAL: Well-developed, well-nourished female in no acute distress.  HEENT:  Normocephalic, atraumatic. External right and left ear normal. No scleral icterus.  NECK: Normal range of motion, supple, no masses noted on observation SKIN: Skin is warm and dry. No rash noted. Not diaphoretic. No erythema. No pallor. MUSCULOSKELETAL: Normal range of motion. No edema noted. NEUROLOGIC: Alert and oriented to person, place, and time. Normal muscle tone coordination. No cranial nerve deficit noted. PSYCHIATRIC: Normal mood and affect. Normal behavior. Normal judgment and thought content. CARDIOVASCULAR: Normal heart rate noted RESPIRATORY: Effort and breath sounds normal, no  problems with respiration noted ABDOMEN: Obese, no distention noted.   PELVIC: Deferred  Labs and Imaging No results found for this or any previous visit (from the past 168 hour(s)). No results found.  Assessment & Plan:  1. Amenorrhea 2. Morbid obesity ([place]) Counseled again about weight loss, given number to [facility] to set up appointment with PCP for possible medical management of morbid obesity.  Talked about using Provera monthly as instructed for withdrawal bleeds as per Dr. Hulan Fray. Weight loss will help in restoring natural ovulatory cycles. - medroxyPROGESTERone (PROVERA) 10 MG tablet; Take 1 tablet (10 mg total) by mouth daily. Use for ten days  Dispense: 10 tablet; Refill: 12  3. Intramural leiomyoma of uterus Naproxen prescribed as needed for pelvic pain. - naproxen (NAPROSYN) 500 MG tablet; Take 1 tablet (500 mg total) by mouth 2 (two) times daily with a meal. As needed for pain  Dispense: 60 tablet; Refill: 2  Routine preventative health maintenance measures emphasized, will be given information about free pap smear screenings.  Please refer to After Visit Summary for other counseling recommendations.   Return in about 2 months (around 05/21/2018) for Followup (with Dr. Hulan Fray or Dr. Harolyn Rutherford).   Total face-to-face time with patient: 15 minutes.  Over 50% of encounter was spent on counseling and coordination of care.   Verita Schneiders, MD, [place] for [facility], [place]

## 2018-04-15 ENCOUNTER — Ambulatory Visit: Payer: Self-pay | Admitting: Family Medicine

## 2018-05-09 ENCOUNTER — Ambulatory Visit: Payer: Self-pay | Attending: Family Medicine | Admitting: Family Medicine

## 2018-05-09 ENCOUNTER — Encounter: Payer: Self-pay | Admitting: Family Medicine

## 2018-05-09 VITALS — BP 127/84 | HR 94 | Temp 99.0°F | Resp 18 | Ht 66.0 in | Wt 300.0 lb

## 2018-05-09 DIAGNOSIS — M545 Low back pain, unspecified: Secondary | ICD-10-CM

## 2018-05-09 DIAGNOSIS — M546 Pain in thoracic spine: Secondary | ICD-10-CM | POA: Insufficient documentation

## 2018-05-09 DIAGNOSIS — R7303 Prediabetes: Secondary | ICD-10-CM

## 2018-05-09 DIAGNOSIS — M549 Dorsalgia, unspecified: Secondary | ICD-10-CM

## 2018-05-09 DIAGNOSIS — R739 Hyperglycemia, unspecified: Secondary | ICD-10-CM

## 2018-05-09 DIAGNOSIS — G8929 Other chronic pain: Secondary | ICD-10-CM

## 2018-05-09 DIAGNOSIS — R8290 Unspecified abnormal findings in urine: Secondary | ICD-10-CM

## 2018-05-09 DIAGNOSIS — Z6841 Body Mass Index (BMI) 40.0 and over, adult: Secondary | ICD-10-CM

## 2018-05-09 LAB — POCT URINALYSIS DIP (CLINITEK)
Bilirubin, UA: NEGATIVE
Glucose, UA: NEGATIVE mg/dL
Ketones, POC UA: NEGATIVE mg/dL
Leukocytes, UA: NEGATIVE
Nitrite, UA: NEGATIVE
POC PROTEIN,UA: NEGATIVE
Spec Grav, UA: 1.02
Urobilinogen, UA: 0.2 U/dL
pH, UA: 5.5

## 2018-05-09 LAB — POCT GLYCOSYLATED HEMOGLOBIN (HGB A1C): Hemoglobin A1C: 6 % — AB (ref 4.0–5.6)

## 2018-05-09 MED ORDER — METFORMIN HCL 500 MG PO TABS: ORAL_TABLET | ORAL | 4 refills | Status: DC

## 2018-05-09 MED FILL — metFORMIN HCL 500 MG TABS: 500 | 30 days supply | Qty: 30 | Fill #0

## 2018-05-09 NOTE — Progress Notes (Signed)
Subjective:    Patient ID: Tiffany Daugherty, female    DOB: January 23, 1981, 37 y.o.   MRN: [id number]   Due to language barrier, patient is accompanied by live interpreter at today's visit  HPI       37 year old female who presents to establish care.  Patient initially stated that she did not have any issues but just wanted to establish care at this office.  Patient states that she has been having issues with her periods patient states that she did start having her period on December 18 but prior to that time it had been 1 year and 3 months since her last menses occurred.  Patient has seen OB/GYN regarding her menstrual issues.      Patient reports no known drug allergies.  Patient states that her only past issues have been absence of her menses and patient states that she was diagnosed with uterine fibroids.  Patient on review of chart has had hospitalization in June 2019 for pelvic inflammatory disease.  Patient reports that her only surgery has been hemorrhoid surgery.  Patient reports family history of 2 brothers with hypertension and stroke.  Patient reports 3 sisters have hypertension.  Patient states that her mother had hypertension and had a stroke.  There is no cancer in the family history, no heart disease and no diabetes.  Patient on review of social history reports that she does not smoke but has an occasional beer.        I discussed with the patient that during her hospitalization for PID back in June she had a very small increase in glucose at 109.  Patient has some occasional fatigue and increased thirst.  Patient also states that she is often hungry.  Patient denies any urinary frequency.  Patient however does report low back pain.  Patient states that she has difficulty standing for prolonged periods secondary to back pain.  Patient states that the pain sometimes goes into her buttocks and is sharp.  This pain is a 6-7 on a 0-to-10 scale.        Review of Systems  Constitutional: Positive  for fatigue. Negative for chills and fever.  HENT: Negative for sore throat and trouble swallowing.   Respiratory: Negative for cough and shortness of breath.   Cardiovascular: Negative for chest pain and palpitations.  Gastrointestinal: Negative for abdominal pain, blood in stool, constipation, diarrhea and nausea.  Endocrine: Positive for polydipsia and polyphagia. Negative for polyuria.  Genitourinary: Positive for menstrual problem. Negative for dysuria, frequency and pelvic pain.  Musculoskeletal: Positive for arthralgias and back pain.  Neurological: Negative for dizziness and headaches.       Objective:   Physical Exam BP 127/84 (BP Location: Right Arm, Patient Position: Sitting, Cuff Size: Large)   Pulse 94   Temp 99 F (37.2 C) (Oral)   Resp 18   Ht 5\' 6"  (1.676 m)   Wt 300 lb (136.1 kg)   LMP 04/30/2018 Comment: Provera has helped periods be regular  SpO2 97%   BMI 48.42 kg/m   General-well-nourished, well-developed obese female in no acute distress.  Patient is accompanied by an interpreter at today's visit Neck-supple, no lymphadenopathy Lungs-clear to auscultation bilaterally Cardiovascular-regular rate and rhythm Abdomen-truncal obesity, patient with mild suprapubic discomfort to palpation, no rebound or guarding Back- patient with right CVA tenderness on exam.  Patient with thoracolumbar paraspinous spasm.  Patient with left SI joint tenderness on exam patient with complaint of some discomfort with seated left leg raise  with increased discomfort radiating into the buttock area on the left Extremities-no edema        Assessment & Plan:  1. Elevated blood sugar Patient with past mild elevation in blood sugar 109 on prior blood work and patient will have hemoglobin A1c at today's visit. - HgB A1c  2. Chronic left-sided low back pain, unspecified whether sciatica present Patient with complaint of chronic left-sided low back pain with radiation to the buttocks.   Patient has been taking twice daily Naprosyn with some relief.  Patient will be referred to orthopedics for further evaluation and treatment  3. Mid back pain on right side Patient with right CVA tenderness and mild suprapubic discomfort on exam.  Patient will have urinalysis at today's visit to look for possible urinary tract infection - POCT URINALYSIS DIP (CLINITEK)  4. Pre-diabetes Patient with hemoglobin A1c of 6.0 consistent with prediabetes.  Patient wishes to try medication in addition to diet and exercise.  Patient will start metformin 500 mg once daily after the evening meal.  Patient will also be referred for nutrition counseling - metFORMIN (GLUCOPHAGE) 500 MG tablet; One pill after the evening meal once daily by mouth  Dispense: 30 tablet; Refill: 4  5. Morbid obesity with BMI of 45.0-49.9, adult [facility]) Patient with morbid obesity and now with prediabetes.  Patient is being referred to nutritionist  6. Abnormal urinalysis Patient with abnormal urinalysis and right CVA tenderness and mild suprapubic discomfort.  Patient will be notified if additional antibiotic therapy is needed based on culture results.  Prescription will be sent to pharmacy for Septra DS twice daily x3 days - Urine Culture  An After Visit Summary was printed and given to the patient.  Return in about 3 months (around 08/08/2018) for pre-diabetes/back pain.

## 2018-05-11 LAB — URINE CULTURE

## 2018-05-14 ENCOUNTER — Encounter: Payer: Self-pay | Admitting: Family Medicine

## 2018-05-14 MED ORDER — SULFAMETHOXAZOLE-TRIMETHOPRIM 800-160 MG PO TABS: 1.0000 | ORAL_TABLET | Freq: Two times a day (BID) | ORAL | 0 refills | Status: AC

## 2018-05-28 ENCOUNTER — Other Ambulatory Visit (HOSPITAL_COMMUNITY): Payer: Self-pay | Admitting: *Deleted

## 2018-05-28 DIAGNOSIS — Z1231 Encounter for screening mammogram for malignant neoplasm of breast: Secondary | ICD-10-CM

## 2018-06-13 ENCOUNTER — Telehealth: Payer: Self-pay | Admitting: *Deleted

## 2018-06-13 NOTE — Telephone Encounter (Signed)
Medical Assistant used [employer] to contact patient.  Interpreter Name: Minette Brine Interpreter #: [id number] Patient verified DOB Patient is aware of no urine infection. No further questions.

## 2018-06-13 NOTE — Telephone Encounter (Signed)
-----   Message from Antony Blackbird, MD sent at 05/11/2018  2:05 PM EST ----- Urine culture showed presence of mixed urogenital flora-no presence of urinary tract infection

## 2018-06-30 ENCOUNTER — Encounter: Payer: Self-pay | Admitting: Obstetrics & Gynecology

## 2018-06-30 ENCOUNTER — Ambulatory Visit (INDEPENDENT_AMBULATORY_CARE_PROVIDER_SITE_OTHER): Payer: BLUE CROSS/BLUE SHIELD | Admitting: Obstetrics & Gynecology

## 2018-06-30 VITALS — BP 128/82 | HR 108 | Wt 300.2 lb

## 2018-06-30 DIAGNOSIS — N912 Amenorrhea, unspecified: Secondary | ICD-10-CM | POA: Diagnosis not present

## 2018-06-30 MED ORDER — IBUPROFEN 800 MG PO TABS: 800.0000 mg | ORAL_TABLET | Freq: Three times a day (TID) | ORAL | 1 refills | Status: DC | PRN

## 2018-06-30 NOTE — Progress Notes (Signed)
   Subjective:    Patient ID: Tiffany Daugherty, female    DOB: 1981-03-06, 38 y.o.   MRN: [id number]  HPI 38 yo 38 y.o. here for follow up for amenorrhea. She had a negative work up with normal TSH, FSH and gyn u/s. She was instructed to use cyclic provera and she had a period 06-28-18. She is now having regular periods.   Review of Systems Her husband lives in [place] and she hasn't seen him for 2 years.    Objective:   Physical Exam Breathing, conversing, and ambulating normally Well nourished, well hydrated Black female, no apparent distress Abd- benign, morbidly obese      Assessment & Plan:  Amenorrhea now resolved with cyclic provera She will come back for an annual (She is having her period today)

## 2018-07-03 ENCOUNTER — Ambulatory Visit (HOSPITAL_COMMUNITY)
Admission: RE | Admit: 2018-07-03 | Discharge: 2018-07-03 | Disposition: A | Discharge status: Home / Self Care | Payer: BLUE CROSS/BLUE SHIELD | Source: Ambulatory Visit | Attending: Physician Assistant | Admitting: Physician Assistant

## 2018-07-03 ENCOUNTER — Ambulatory Visit (HOSPITAL_BASED_OUTPATIENT_CLINIC_OR_DEPARTMENT_OTHER): Payer: BLUE CROSS/BLUE SHIELD | Admitting: Physician Assistant

## 2018-07-03 VITALS — BP 131/85 | HR 89 | Temp 98.6°F | Resp 18 | Ht 66.0 in | Wt 303.0 lb

## 2018-07-03 DIAGNOSIS — R7303 Prediabetes: Secondary | ICD-10-CM

## 2018-07-03 DIAGNOSIS — M62838 Other muscle spasm: Secondary | ICD-10-CM

## 2018-07-03 DIAGNOSIS — M545 Low back pain: Secondary | ICD-10-CM

## 2018-07-03 DIAGNOSIS — Z789 Other specified health status: Secondary | ICD-10-CM

## 2018-07-03 DIAGNOSIS — G8929 Other chronic pain: Secondary | ICD-10-CM

## 2018-07-03 LAB — GLUCOSE, POCT (MANUAL RESULT ENTRY): POC GLUCOSE: 86 mg/dL (ref 70–99)

## 2018-07-03 MED ORDER — NAPROXEN 500 MG PO TABS: 500.0000 mg | ORAL_TABLET | Freq: Two times a day (BID) | ORAL | 1 refills | Status: DC

## 2018-07-03 MED ORDER — METHOCARBAMOL 500 MG PO TABS: ORAL_TABLET | ORAL | 0 refills | Status: DC

## 2018-07-03 MED FILL — NAPROXEN 500 MG TABLET: 500 | 30 days supply | Qty: 60 | Fill #0

## 2018-07-03 MED FILL — METHOCARBAMOL 500 MG TABS: 500 | 15 days supply | Qty: 90 | Fill #0

## 2018-07-03 NOTE — Patient Instructions (Signed)

## 2018-07-03 NOTE — Progress Notes (Signed)
Patient ID: Tiffany Daugherty, female   DOB: [birth year], 38 y.o.   MRN: [id number]      Tiffany Daugherty, is a 38 y.o. female  FGH:[id number]  [id number]:[id number]  DOB - 1981/03/28  Subjective:  Chief Complaint and HPI: Tiffany Daugherty is a 38 y.o. female here today Pain in lower back, worse with standing for a long period of time.  This pain started when she was back in the [place] about 3 years ago.  It has worsened over the last 3 weeks.  She got a new job working in [employer] which has made the pain much worse.  No radicular s/sx.  No urinary s/sx  [employer] interpreters "Aline" used.     ROS:   Constitutional:  No f/c, No night sweats, No unexplained weight loss. EENT:  No vision changes, No blurry vision, No hearing changes. No mouth, throat, or ear problems.  Respiratory: No cough, No SOB Cardiac: No CP, no palpitations GI:  No abd pain, No N/V/D. GU: No Urinary s/sx Musculoskeletal: +LBP Neuro: No headache, no dizziness, no motor weakness.  Skin: No rash Endocrine:  No polydipsia. No polyuria.  Psych: Denies SI/HI  No problems updated.  ALLERGIES: No Known Allergies  PAST MEDICAL HISTORY: Past Medical History:  Diagnosis Date  . Uterine cyst     MEDICATIONS AT HOME: Prior to Admission medications   Medication Sig Start Date End Date Taking? Authorizing Provider  acetaminophen (TYLENOL) 325 MG tablet Take 650 mg by mouth every 6 (six) hours as needed.   Yes [provider]  medroxyPROGESTERone (PROVERA) 10 MG tablet Take 1 tablet (10 mg total) by mouth daily. Use for ten days 03/21/18  Yes Anyanwu, Sallyanne Havers, MD  metFORMIN (GLUCOPHAGE) 500 MG tablet One pill after the evening meal once daily by mouth 05/09/18  Yes Fulp, Cammie, MD  methocarbamol (ROBAXIN) 500 MG tablet 1-2 tid X 10 days then prn spasm 07/03/18   Freeman Caldron M, PA-C  naproxen (NAPROSYN) 500 MG tablet Take 1 tablet (500 mg total) by mouth 2 (two) times daily with a meal. X 10 days then prn pain  07/03/18   Argentina Donovan, PA-C     Objective:  EXAM:   Vitals:   07/03/18 1359  BP: 131/85  Pulse: 89  Resp: 18  Temp: 98.6 F (37 C)  TempSrc: Oral  SpO2: 98%  Weight: (!) 303 lb (137.4 kg)  Height: 5\' 6"  (1.676 m)    General appearance : A&OX3. NAD. Non-toxic-appearing, obese HEENT: Atraumatic and Normocephalic.  PERRLA. EOM intact.  Chest/Lungs:  Breathing-non-labored, Good air entry bilaterally, breath sounds normal without rales, rhonchi, or wheezing  CVS: S1 S2 regular, no murmurs, gallops, rubs  Back:  ROM ~80% of normal.  Neg SLR B.  DTR=B. Extremities: Bilateral Lower Ext shows no edema, both legs are warm to touch with = pulse throughout Neurology:  CN II-XII grossly intact, Non focal.   Psych:  TP linear. J/I WNL. Normal speech. Appropriate eye contact and affect.  Skin:  No Rash  Data Review Lab Results  Component Value Date   HGBA1C 6.0 (A) 05/09/2018     Assessment & Plan   1. Chronic bilateral low back pain without sciatica No red flags Likely musculoskeletal and due to de-conditioning in addition to new job/activities - DG Lumbar Spine Complete; Future - naproxen (NAPROSYN) 500 MG tablet; Take 1 tablet (500 mg total) by mouth 2 (two) times daily with a meal. X 10 days then prn pain  Dispense: 60 tablet; Refill: 1 - Ambulatory referral to Orthopedic Surgery  2. Pre-diabetes Stable.  No changes - Glucose (CBG)  3. Muscle spasm - methocarbamol (ROBAXIN) 500 MG tablet; 1-2 tid X 10 days then prn spasm  Dispense: 90 tablet; Refill: 0 - Ambulatory referral to Orthopedic Surgery  4. Language barrier [employer] interpreters used and additional time performing visit was required.   Patient have been counseled extensively about nutrition and exercise  Return for for March appt with Dr Chapman Fitch.  The patient was given clear instructions to go to ER or return to medical center if symptoms don't improve, worsen or new problems develop. The patient  verbalized understanding. The patient was told to call to get lab results if they haven't heard anything in the next week.     Freeman Caldron, PA-C [facility] [place], [place]   07/03/2018, 2:08 PM

## 2018-07-09 ENCOUNTER — Ambulatory Visit (INDEPENDENT_AMBULATORY_CARE_PROVIDER_SITE_OTHER): Payer: BLUE CROSS/BLUE SHIELD | Admitting: Family Medicine

## 2018-07-09 ENCOUNTER — Encounter (INDEPENDENT_AMBULATORY_CARE_PROVIDER_SITE_OTHER): Payer: Self-pay | Admitting: Family Medicine

## 2018-07-09 DIAGNOSIS — E6609 Other obesity due to excess calories: Secondary | ICD-10-CM

## 2018-07-09 DIAGNOSIS — Z6838 Body mass index (BMI) 38.0-38.9, adult: Secondary | ICD-10-CM

## 2018-07-09 DIAGNOSIS — M545 Low back pain, unspecified: Secondary | ICD-10-CM

## 2018-07-09 MED ORDER — TIZANIDINE HCL 2 MG PO TABS: 2.0000 mg | ORAL_TABLET | Freq: Four times a day (QID) | ORAL | 1 refills | Status: DC | PRN

## 2018-07-09 MED ORDER — NABUMETONE 750 MG PO TABS: 750.0000 mg | ORAL_TABLET | Freq: Two times a day (BID) | ORAL | 6 refills | Status: DC | PRN

## 2018-07-09 MED ORDER — VITAMIN D-3 125 MCG (5000 UT) PO TABS: 1.0000 | ORAL_TABLET | Freq: Every day | ORAL | 3 refills | Status: DC
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
## 2018-07-21 ENCOUNTER — Telehealth: Payer: Self-pay | Admitting: *Deleted

## 2018-07-21 NOTE — Telephone Encounter (Signed)
Medical Assistant used [employer] to contact patient.  Interpreter Name: Jeneen Montgomery Interpreter #: [phone] Patient was not available, [employer]pre[employer] left patient a voicemail. Voicemail states to give a call back to [place] with [facility] at [phone]. Patient is aware of arthritis being noted on the xray. She was advised to implement weight loss and exercise to help with stamina. Patient is scheduled for PT on 07/23/2018 and needs to follow up with [facility] for treatment options for arthritis.

## 2018-07-21 NOTE — Telephone Encounter (Signed)
-----   Message from Argentina Donovan, [place] sent at 07/05/2018  6:53 PM EST ----- Please call patient.  Her back x-rays show arthritis.  Weight loss and exercise would help with improving her stamina with work.  We will have her see an orthopedist for additional treatment options.  Thanks, Freeman Caldron, PA-C

## 2018-07-23 ENCOUNTER — Ambulatory Visit: Payer: BLUE CROSS/BLUE SHIELD | Attending: Family Medicine | Admitting: Physical Therapy

## 2018-07-23 ENCOUNTER — Other Ambulatory Visit: Payer: Self-pay

## 2018-07-23 DIAGNOSIS — M6281 Muscle weakness (generalized): Secondary | ICD-10-CM | POA: Diagnosis present

## 2018-07-23 DIAGNOSIS — G8929 Other chronic pain: Secondary | ICD-10-CM | POA: Insufficient documentation

## 2018-07-23 DIAGNOSIS — R252 Cramp and spasm: Secondary | ICD-10-CM | POA: Diagnosis present

## 2018-07-23 DIAGNOSIS — R293 Abnormal posture: Secondary | ICD-10-CM | POA: Diagnosis present

## 2018-07-23 DIAGNOSIS — M545 Low back pain: Secondary | ICD-10-CM | POA: Insufficient documentation

## 2018-07-25 ENCOUNTER — Encounter: Payer: Self-pay | Admitting: Physical Therapy

## 2018-07-25 NOTE — Patient Instructions (Signed)
Access Code: [email or web address]  URL: [email or web address]  Date: 07/25/2018  Prepared by: Jari Favre   Exercises  Supine Lower Trunk Rotation - 10 reps - 3 sets - 1x daily - 7x weekly  Single Knee to Chest Stretch - 10 reps - 3 sets - 1x daily - 7x weekly  Supine Posterior Pelvic Tilt - 10 reps - 3 sets - 1x daily - 7x weekly

## 2018-07-25 NOTE — Therapy (Addendum)
[facility] [address] [place], [place], [address] Phone: [phone]   Fax:  [phone]  Physical Therapy Evaluation  Patient Details  Name: Tiffany Daugherty MRN: [id number] Date of Birth: [birth year] Referring Provider (PT): Eunice Blase, MD   Encounter Date: 07/23/2018  PT End of Session - 07/25/18 1306    Visit Number  1    Date for PT Re-Evaluation  09/17/18    PT Start Time  1505    PT Stop Time  1529    PT Time Calculation (min)  42 min    Activity Tolerance  Patient tolerated treatment well;Patient limited by pain       Past Medical History:  Diagnosis Date  . Uterine cyst     Past Surgical History:  Procedure Laterality Date  . HERNIA REPAIR      There were no vitals filed for this visit.   Subjective Assessment - 07/25/18 1343    Subjective  Everything I do makes it works, lifting and working in [employer].  Bending forward.  Gets worst for 2-3 hours.  Since January getting worse.    Patient is accompained by:  Interpreter   phone   Limitations  Standing;Lifting    Patient Stated Goals  be able to do job without pain    Currently in Pain?  Yes    Pain Score  6    9-10 worst   Pain Location  Back    Pain Orientation  Right;Left    Pain Descriptors / Indicators  Burning;Sharp    Pain Type  Chronic pain    Pain Onset  More than a month ago    Pain Frequency  Intermittent    Aggravating Factors   work, lifting, bending    Pain Relieving Factors  medication    Multiple Pain Sites  No         [facility] PT Assessment - 07/25/18 0001      Assessment   Medical Diagnosis  M54.5 (ICD-10-CM) - Low back pain, unspecified back pain laterality, unspecified chronicity, unspecified whether sciatica present    Referring Provider (PT)  Eunice Blase, MD    Onset Date/Surgical Date  --   January 2020     Precautions   Precautions  None      Restrictions   Weight Bearing Restrictions  No      Balance Screen   Has the patient fallen in the past 6 months  No      [place] residence    Living Arrangements  Other relatives      Prior Function   Level of Independence  Independent    Vocation  Full time employment   kitchen/cafeteria work     Cognition   Overall Cognitive Status  Within Functional Limits for tasks assessed      Observation/Other Assessments   Focus on Therapeutic Outcomes (FOTO)   No FOTO due to time restraints with interpreter      Posture/Postural Control   Posture/Postural Control  Postural limitations    Postural Limitations  Increased lumbar lordosis;Increased thoracic kyphosis      ROM / Strength   AROM / PROM / Strength  AROM;PROM;Strength      AROM   AROM Assessment Site  Lumbar    Lumbar Flexion  28cm from floor +pain    Lumbar Extension  pain and almost at end range in standing    Lumbar - Right  Side Bend  slight pain    Lumbar - Left Side Bend  slight pain      PROM   Overall PROM Comments  hip ER/IR 25%limited      Strength   Overall Strength Comments  hip flexor strength 4-/5 +back pain; 4+/5 for other LE MMT      Flexibility   Soft Tissue Assessment /Muscle Length  yes    Hamstrings  60% limited      Palpation   Palpation comment  lumbar fascial restriction; decreased pain with lumbar decompression      Special Tests    Special Tests  Lumbar    Lumbar Tests  Straight Leg Raise      Straight Leg Raise   Findings  Negative    Comment  bil      Ambulation/Gait   Gait Pattern  Decreased stride length   stiff and guarded               Objective measurements completed on examination: See above findings.      [place] Adult PT Treatment/Exercise - 07/25/18 0001      Self-Care   Self-Care  Other Self-Care Comments    Other Self-Care Comments   initial HEP as seen in chart demo and performed             PT Education - 07/25/18 1302    Education Details   Access Code:  Careers information officer) Educated  Patient;Other (comment)   interpreter   Methods  Explanation;Demonstration;Verbal cues;Handout       PT Short Term Goals - 07/25/18 1326      PT SHORT TERM GOAL #1   Title  Pt will be ind with intial HEP    Time  4    Period  Weeks    Status  New    [place] Date  08/20/18      PT SHORT TERM GOAL #2   Title  Pt will demosntrate good lifting mechanics for greater function at work    Time  4    Period  Weeks    Status  New    Target Date  08/20/18        PT Long Term Goals - 07/25/18 1325      PT LONG TERM GOAL #1   Title  Pt will be ind with advanced HEP    Time  8    Period  Weeks    Status  New    Target Date  09/17/18      PT LONG TERM GOAL #2   Title  Pt will report 50% less pain at work due to improved strength and body mechanics    Time  8    Period  Weeks    Status  New    Target Date  09/17/18      PT LONG TERM GOAL #3   Title  Pt will be able to demonstrate 5/5 hip flexion bilaterally for improved stability with funcitonal bending and lifting at work    Time  8    Period  Weeks    Status  New    Target Date  09/17/18      PT LONG TERM GOAL #4   Title  Pt will improved lumbar flexion to be able to reach 8 cm away from the floor without increased pain    Baseline  28cm from the floor    Time  8    Period  Weeks    Status  New    Target Date  09/17/18             Plan - 07/25/18 1309    Clinical Impression Statement  Patient presents to skilled PT due to chronic low back pain that has increased since January since beginning a new, more physically demanding job.  She is standing and bending a lot at work.  Pt has LE weakness and decreased lumbar AROM both of which are painful.  She has painful passive accessory movement at L3.  Pt does not have pain into LE but it wraps around bilateral to both hips.  Pt has increased lumbar lordosis.  She has decreased h/s flexibility.  She has negative SLR test and pain with SLR  likely from hamstring tension.  She will benefit from skilled PT to address impairments and improve core strength for greater function at work    Personal Factors and Comorbidities  Fitness;Social Background;Comorbidity 2    Comorbidities  acute exacerbation of chronic pain, obesity, needs interpreter    Examination-Activity Limitations  Bend;Carry;Stand    Examination-Participation Restrictions  Meal Prep;Cleaning;Community Activity    Stability/Clinical Decision Making  Evolving/Moderate complexity    Clinical Decision Making  Moderate    Rehab Potential  Good    PT Frequency  2x / week    PT Duration  8 weeks    PT Treatment/Interventions  ADLs/Self Care Home Management;Cryotherapy;Moist Heat;Traction;Electrical Stimulation;Functional mobility training;Therapeutic activities;Therapeutic exercise;Neuromuscular re-education;Patient/family education;Spinal Manipulations;Joint Manipulations;Taping;Dry needling;Passive range of motion;Manual techniques    PT Next Visit Plan  lumbar ROM, core strengthening, hip flexor strengthening, STM  and modalities for pain management, traction/stim, etc    PT Home Exercise Plan   Access Code: [email or web address]     Consulted and Agree with Plan of Care  Patient       Patient will benefit from skilled therapeutic intervention in order to improve the following deficits and impairments:  Pain, Hypomobility, Impaired flexibility, Postural dysfunction, Decreased strength, Obesity, Abnormal gait, Decreased range of motion, Increased fascial restricitons, Increased muscle spasms  Visit Diagnosis: Chronic bilateral low back pain without sciatica  Abnormal posture  Cramp and spasm  Muscle weakness (generalized)     Problem List There are no active problems to display for this patient.   Camillo Flaming Luca Dyar, PT 07/25/2018, 1:50 PM  [facility] [address], [place], [place], [address] Phone:  [phone]   Fax:  [phone]  Name: Tiffany Daugherty MRN: [id number] Date of Birth: 11/29/1980   PHYSICAL THERAPY DISCHARGE SUMMARY  Visits from Start of Care: 1  Current functional level related to goals / functional outcomes: See above eval only   Remaining deficits: See above   Education / Equipment: HEP  Plan: Patient agrees to discharge.  Patient goals were not met. Patient is being discharged due to not returning since the last visit.  ?????     [employer], PT 10/13/18 5:07 PM

## 2018-08-04 ENCOUNTER — Other Ambulatory Visit (HOSPITAL_COMMUNITY): Payer: Self-pay | Admitting: *Deleted

## 2018-08-04 DIAGNOSIS — Z1231 Encounter for screening mammogram for malignant neoplasm of breast: Secondary | ICD-10-CM

## 2018-08-06 ENCOUNTER — Ambulatory Visit (INDEPENDENT_AMBULATORY_CARE_PROVIDER_SITE_OTHER): Payer: BLUE CROSS/BLUE SHIELD | Admitting: Family Medicine

## 2018-08-06 ENCOUNTER — Encounter (INDEPENDENT_AMBULATORY_CARE_PROVIDER_SITE_OTHER): Payer: Self-pay | Admitting: Family Medicine

## 2018-08-06 ENCOUNTER — Other Ambulatory Visit: Payer: Self-pay

## 2018-08-06 DIAGNOSIS — M545 Low back pain, unspecified: Secondary | ICD-10-CM

## 2018-08-06 MED ORDER — BACLOFEN 10 MG PO TABS: 5.0000 mg | ORAL_TABLET | Freq: Three times a day (TID) | ORAL | 3 refills | Status: DC | PRN

## 2018-08-06 MED ORDER — ETODOLAC 400 MG PO TABS
400.0000 mg | ORAL_TABLET | Freq: Two times a day (BID) | ORAL | 3 refills | Status: DC | PRN
Start: 2018-08-06 — End: 2020-01-11

## 2018-08-06 NOTE — Progress Notes (Signed)
Video Interpreter:  Magdalen Spatz [phone]    Office Visit Note   Patient: Tiffany Daugherty           Date of Birth: 05-11-1981           MRN: [id number] Visit Date: 08/06/2018 Requested by: Antony Blackbird, MD [place], [place] [address] PCP: Antony Blackbird, MD  Subjective: Chief Complaint  Patient presents with  . Lower Back - Pain    HPI: Interpreter used for the entire visit.  She is here for follow-up low back pain.  Symptoms are better with Relafen, but she still has troubles when standing and walking.  No radicular symptoms, no weakness or numbness, no bowel or bladder dysfunction.  She is doing exercises prescribed by physical therapy.  She is trying to walk for exercise about 45 minutes/week.              ROS: No fevers or chills.  All other systems were reviewed and are negative.  Objective: Vital Signs: There were no vitals taken for this visit.  Physical Exam:  General:  Alert and oriented, in no acute distress. Pulm:  Breathing unlabored. Psy:  Normal mood, congruent affect. Skin: No rash on her skin. Low back: She has tenderness near the L4-5 and L5-S1 levels in the paraspinous muscles.  No pain over the SI joints or in the sciatic notch, negative straight leg raise, lower extremity strength and reflexes remain normal.  Imaging: None today.  Assessment & Plan: 1.  Persistent low back pain, possibly facet arthropathy.  Neurologic exam is nonfocal. -MRI ordered but due to the coronavirus, we likely will be able to get this done for several weeks.  We will try different medications.  She will continue with home exercises.  We will discuss MRI results when available.  Could contemplate facet injections depending on the findings, or possibly referral to chiropractor.     Procedures: No procedures performed  No notes on file     PMFS History: There are no active problems to display for this patient.  Past Medical History:  Diagnosis Date  . Uterine cyst      History reviewed. No pertinent family history.  Past Surgical History:  Procedure Laterality Date  . HERNIA REPAIR     Social History   Occupational History  . Not on file  Tobacco Use  . Smoking status: Never Smoker  . Smokeless tobacco: Never Used  Substance and Sexual Activity  . Alcohol use: Yes  . Drug use: No  . Sexual activity: Yes    Birth control/protection: None

## 2018-08-08 ENCOUNTER — Telehealth: Payer: Self-pay | Admitting: Family Medicine

## 2018-08-08 ENCOUNTER — Ambulatory Visit: Payer: Self-pay | Admitting: Family Medicine

## 2018-08-08 ENCOUNTER — Encounter

## 2018-08-12 ENCOUNTER — Ambulatory Visit (HOSPITAL_COMMUNITY): Payer: Self-pay

## 2018-08-12 ENCOUNTER — Encounter: Payer: BLUE CROSS/BLUE SHIELD | Admitting: Physical Therapy

## 2018-08-13 ENCOUNTER — Ambulatory Visit: Payer: Self-pay | Admitting: Obstetrics & Gynecology

## 2018-08-14 ENCOUNTER — Encounter: Payer: BLUE CROSS/BLUE SHIELD | Admitting: Physical Therapy

## 2018-08-19 ENCOUNTER — Encounter: Payer: BLUE CROSS/BLUE SHIELD | Admitting: Physical Therapy

## 2018-08-21 ENCOUNTER — Encounter: Payer: BLUE CROSS/BLUE SHIELD | Admitting: Physical Therapy

## 2018-08-26 ENCOUNTER — Encounter: Payer: BLUE CROSS/BLUE SHIELD | Admitting: Physical Therapy

## 2018-08-28 ENCOUNTER — Encounter: Payer: BLUE CROSS/BLUE SHIELD | Admitting: Physical Therapy

## 2018-09-02 ENCOUNTER — Encounter: Payer: BLUE CROSS/BLUE SHIELD | Admitting: Physical Therapy

## 2018-09-04 ENCOUNTER — Encounter: Payer: BLUE CROSS/BLUE SHIELD | Admitting: Physical Therapy

## 2018-09-08 ENCOUNTER — Ambulatory Visit: Payer: BLUE CROSS/BLUE SHIELD | Attending: Family Medicine | Admitting: Family Medicine

## 2018-09-08 ENCOUNTER — Other Ambulatory Visit: Payer: Self-pay

## 2018-09-08 ENCOUNTER — Encounter: Payer: Self-pay | Admitting: Family Medicine

## 2018-09-08 DIAGNOSIS — H539 Unspecified visual disturbance: Secondary | ICD-10-CM

## 2018-09-08 DIAGNOSIS — R7303 Prediabetes: Secondary | ICD-10-CM | POA: Diagnosis not present

## 2018-09-08 MED ORDER — METFORMIN HCL 500 MG PO TABS: ORAL_TABLET | ORAL | 4 refills | Status: DC

## 2018-09-08 NOTE — Progress Notes (Signed)
Per patient this is a DM follow up to see how the medication is working

## 2018-09-08 NOTE — Progress Notes (Signed)
Virtual Visit via Telephone Note  I connected with Tiffany Daugherty on 09/08/18 at 10:10 AM EDT by telephone and verified that I am speaking with the correct person using two identifiers.   I discussed the limitations, risks, security and privacy concerns of performing an evaluation and management service by telephone and the availability of in person appointments. I also discussed with the patient that there may be a patient responsible charge related to this service. The patient expressed understanding and agreed to proceed.  Patient Location: Home Provider Location: Office Others participating in call: Emilio Aspen, [place] and Interpreter [place] ID [id number]  Due to a language barrier a [place] Interpreter was obtained through [employer]   History of Present Illness:      38 yo female who was last seen in the office on 07/03/18 by another provider in follow-up of pre-diabetes, and acute on chronic low back pain. Since that time she has seen Orthopedics in follow-up of her back pain and MRI has been ordered but delayed until June due to scheduling restrictions due to COVID-19. She also attended physical therapy. She is medication for pain and medication for muscle spasm per her Orthopedic doctor but does not know the names of the medicines.  Last Hgb A1c was 6.0 on 05/09/18 and she is currently on metformin. She denies any increased thirst and no urinary frequency. She has noticed occasional blurred vision and she does not see as well at night.    Past Medical History:  Diagnosis Date  . Uterine cyst     Past Surgical History:  Procedure Laterality Date  . HERNIA REPAIR      History reviewed. No pertinent family history.  Social History   Tobacco Use  . Smoking status: Never Smoker  . Smokeless tobacco: Never Used  Substance Use Topics  . Alcohol use: Yes  . Drug use: No     No Known Allergies    Review of Systems  Constitutional: Negative for chills and fever.    HENT: Negative for congestion and sore throat.   Eyes: Positive for blurred vision. Negative for double vision.  Respiratory: Negative for cough and shortness of breath.   Cardiovascular: Negative for chest pain and palpitations.  Gastrointestinal: Negative for abdominal pain, constipation, diarrhea, nausea and vomiting.  Genitourinary: Negative for dysuria and frequency.  Musculoskeletal: Positive for back pain. Negative for myalgias.  Neurological: Negative for dizziness and headaches.  Endo/Heme/Allergies: Negative for polydipsia. Does not bruise/bleed easily.   Observations/Objective: No vital signs or physical exam conducted as visit was done via telephone  Assessment and Plan: 1. Pre-diabetes Patient reports compliance with use of metformin and will make a lab only visit in about 4 weeks for Hgb A1c and BMP. Referral will be placed for patient to see an eye specialist. Patient will be notified if medication changes are needed based on her labs - Hemoglobin A1c; Future - Ambulatory referral to Optometry - metFORMIN (GLUCOPHAGE) 500 MG tablet; One pill after the evening meal once daily by mouth  Dispense: 30 tablet; Refill: 4 - Basic Metabolic Panel; Future  2. Visual disturbance Referral to Optometry due to complaint of occasional blurred vision as well as difficulty with vision at night - Ambulatory referral to [facility] in about 6 months (around 03/10/2019) for pre-diabetes; labs in 4 weeks; .    I discussed the assessment and treatment plan with the patient. The patient was provided an opportunity to ask questions and all were  answered. The patient agreed with the plan and demonstrated an understanding of the instructions.   The patient was advised to call back or seek an in-person evaluation if the symptoms worsen or if the condition fails to improve as anticipated.  I provided 12 minutes of non-face-to-face time during this  encounter.   Antony Blackbird, MD

## 2018-09-09 ENCOUNTER — Encounter: Payer: BLUE CROSS/BLUE SHIELD | Admitting: Physical Therapy

## 2018-09-11 ENCOUNTER — Encounter: Payer: BLUE CROSS/BLUE SHIELD | Admitting: Physical Therapy

## 2018-09-16 ENCOUNTER — Ambulatory Visit: Payer: BLUE CROSS/BLUE SHIELD | Admitting: Physical Therapy

## 2018-09-18 ENCOUNTER — Encounter: Payer: BLUE CROSS/BLUE SHIELD | Admitting: Physical Therapy

## 2018-11-18 ENCOUNTER — Ambulatory Visit (HOSPITAL_COMMUNITY): Payer: Self-pay

## 2019-06-15 ENCOUNTER — Other Ambulatory Visit: Payer: Self-pay | Admitting: Obstetrics & Gynecology

## 2019-06-15 ENCOUNTER — Other Ambulatory Visit: Payer: Self-pay | Admitting: Family Medicine

## 2019-06-15 DIAGNOSIS — R7303 Prediabetes: Secondary | ICD-10-CM

## 2019-06-15 DIAGNOSIS — N912 Amenorrhea, unspecified: Secondary | ICD-10-CM

## 2020-01-11 ENCOUNTER — Ambulatory Visit (INDEPENDENT_AMBULATORY_CARE_PROVIDER_SITE_OTHER): Payer: BLUE CROSS/BLUE SHIELD | Admitting: Obstetrics & Gynecology

## 2020-01-11 ENCOUNTER — Inpatient Hospital Stay (HOSPITAL_COMMUNITY): Admit: 2020-01-11 | Payer: BLUE CROSS/BLUE SHIELD

## 2020-01-11 ENCOUNTER — Other Ambulatory Visit: Payer: Self-pay

## 2020-01-11 VITALS — BP 133/85 | Ht 64.0 in | Wt 278.0 lb

## 2020-01-11 DIAGNOSIS — Z01419 Encounter for gynecological examination (general) (routine) without abnormal findings: Secondary | ICD-10-CM | POA: Diagnosis not present

## 2020-01-11 DIAGNOSIS — D251 Intramural leiomyoma of uterus: Secondary | ICD-10-CM | POA: Diagnosis not present

## 2020-01-11 DIAGNOSIS — Z1231 Encounter for screening mammogram for malignant neoplasm of breast: Secondary | ICD-10-CM

## 2020-01-11 MED ORDER — NAPROXEN SODIUM 550 MG PO TABS: 550.0000 mg | ORAL_TABLET | Freq: Two times a day (BID) | ORAL | 5 refills | Status: AC

## 2020-01-11 NOTE — Progress Notes (Signed)
GYNECOLOGY ANNUAL PREVENTATIVE CARE ENCOUNTER NOTE  History:     Tiffany Daugherty is a 39 y.o. G19P1011 female here for a routine annual gynecologic exam.  Patient is French-speaking only, interpreter present for this encounter.  Current complaints: pain from fibroid during her periods that is moderate-severe and radiates down right leg, also passing clots during period. Of note, ultrasound in 10/16/2017 showed 4 cm anterior left fundal fibroid and 1.2 cm anterior left body fibroid.  No presyncopal symptoms reported.   Denies abnormal vaginal discharge, other pelvic pain outside period, problems with intercourse or other gynecologic concerns.    Gynecologic History Patient's last menstrual period was 12/15/2019. Contraception: none Last Pap: Unsure. Results were: normal with negative HPV Last mammogram: Never had one.  Obstetric History OB History  Gravida Para Term Preterm AB Living  2 1 1   1 1   SAB TAB Ectopic Multiple Live Births    1     1    # Outcome Date GA Lbr Len/2nd Weight Sex Delivery Anes PTL Lv  2 TAB 2013          1 Term 2011     Vag-Spont   LIV    Past Medical History:  Diagnosis Date  . Uterine cyst     Past Surgical History:  Procedure Laterality Date  . HERNIA REPAIR      Current Outpatient Medications on File Prior to Visit  Medication Sig Dispense Refill  . [DISCONTINUED] medroxyPROGESTERone (PROVERA) 10 MG tablet Take 1 tablet (10 mg total) by mouth daily. Use for ten days 10 tablet 12   No current facility-administered medications on file prior to visit.    No Known Allergies  Social History:  reports that she has never smoked. She has never used smokeless tobacco. She reports current alcohol use. She reports that she does not use drugs.  No family history on file.  The following portions of the patient's history were reviewed and updated as appropriate: allergies, current medications, past family history, past medical history, past social history,  past surgical history and problem list.  Review of Systems Pertinent items noted in HPI and remainder of comprehensive ROS otherwise negative.  Physical Exam:  BP 133/85   Ht 5\' 4"  (1.626 m)   Wt 278 lb (126.1 kg)   LMP 12/15/2019   BMI 47.72 kg/m  CONSTITUTIONAL: Well-developed, well-nourished female in no acute distress.  HENT:  Normocephalic, atraumatic, External right and left ear normal. Oropharynx is clear and moist EYES: Conjunctivae and EOM are normal. Pupils are equal, round, and reactive to light. No scleral icterus.  NECK: Normal range of motion, supple, no masses.  Normal thyroid.  SKIN: Skin is warm and dry. No rash noted. Not diaphoretic. No erythema. No pallor. MUSCULOSKELETAL: Normal range of motion. No tenderness.  No cyanosis, clubbing, or edema.  2+ distal pulses. NEUROLOGIC: Alert and oriented to person, place, and time. Normal reflexes, muscle tone coordination.  PSYCHIATRIC: Normal mood and affect. Normal behavior. Normal judgment and thought content. CARDIOVASCULAR: Normal heart rate noted, regular rhythm RESPIRATORY: Clear to auscultation bilaterally. Effort and breath sounds normal, no problems with respiration noted. BREASTS: Symmetric in size. No masses, tenderness, skin changes, nipple drainage, or lymphadenopathy bilaterally. Performed in the presence of a chaperone. ABDOMEN: Soft, no distention noted.  No tenderness, rebound or guarding.  PELVIC: Normal appearing external genitalia and urethral meatus; normal appearing vaginal mucosa and cervix.  No abnormal discharge noted.  Pap smear obtained.  Normal uterine  size, no other palpable masses, no uterine or adnexal tenderness.  Performed in the presence of a chaperone.   Assessment and Plan:      1. 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  Patient wants to avoid surgery for now (surgery was not recommended at this point). - naproxen sodium (ANAPROX) 550 MG  tablet; Take 1 tablet (550 mg total) by mouth 2 (two) times daily with a meal. Take starting two days before her period and during her period  Dispense: 60 tablet; Refill: 5  2. Breast cancer screening by mammogram Mammogram scheduled - MM 3D SCREEN BREAST BILATERAL; Future  3. Well woman exam with routine gynecological exam - Cytology - PAP( [facility]) Will follow up results of pap smear and manage accordingly. Routine preventative health maintenance measures emphasized. Please refer to After Visit Summary for other counseling recommendations.      Verita Schneiders, MD, [place] [facility] [employer], [place]

## 2020-01-11 NOTE — Patient Instructions (Signed)

## 2020-01-11 NOTE — Progress Notes (Signed)
Mammogram scheduled at [facility] for September 23 at 10:10 AM. Patient notified.  Lovena Le, RN

## 2020-01-14 LAB — CYTOLOGY - PAP
Comment: NEGATIVE
Diagnosis: NEGATIVE
High risk HPV: NEGATIVE

## 2020-02-04 ENCOUNTER — Ambulatory Visit: Payer: BLUE CROSS/BLUE SHIELD

## 2020-02-04 ENCOUNTER — Other Ambulatory Visit: Payer: Self-pay

## 2023-05-06 ENCOUNTER — Encounter (HOSPITAL_COMMUNITY): Payer: Self-pay | Admitting: Emergency Medicine

## 2023-05-06 ENCOUNTER — Emergency Department (HOSPITAL_COMMUNITY)
Admission: EM | Admit: 2023-05-06 | Discharge: 2023-05-06 | Disposition: A | Discharge status: Home / Self Care | Payer: BLUE CROSS/BLUE SHIELD | Attending: Emergency Medicine | Admitting: Emergency Medicine

## 2023-05-06 ENCOUNTER — Emergency Department (HOSPITAL_COMMUNITY): Payer: BLUE CROSS/BLUE SHIELD

## 2023-05-06 ENCOUNTER — Other Ambulatory Visit: Payer: Self-pay

## 2023-05-06 DIAGNOSIS — R519 Headache, unspecified: Secondary | ICD-10-CM | POA: Insufficient documentation

## 2023-05-06 MED ORDER — KETOROLAC TROMETHAMINE 15 MG/ML IJ SOLN
15.0000 mg | Freq: Once | INTRAMUSCULAR | Status: AC
  Administered 2023-05-06: 15 mg via INTRAVENOUS
  Filled 2023-05-06: qty 1

## 2023-05-06 MED ORDER — PROCHLORPERAZINE EDISYLATE 10 MG/2ML IJ SOLN
10.0000 mg | Freq: Once | INTRAMUSCULAR | Status: AC
  Administered 2023-05-06: 10 mg via INTRAVENOUS
  Filled 2023-05-06: qty 2

## 2023-05-06 MED ORDER — DIPHENHYDRAMINE HCL 50 MG/ML IJ SOLN
12.5000 mg | Freq: Once | INTRAMUSCULAR | Status: AC
  Administered 2023-05-06: 12.5 mg via INTRAVENOUS
  Filled 2023-05-06: qty 1

## 2023-05-06 NOTE — Discharge Instructions (Addendum)
Your headache improved after medications in the emergency department.  Follow-up with your primary care provider.  Take ibuprofen, Tylenol as you need to for your headache.  For any concerning symptoms return to the emergency room.

## 2023-05-06 NOTE — ED Provider Notes (Signed)
[place] EMERGENCY DEPARTMENT AT [facility] Provider Note   CSN: [id number] Arrival date & time: 05/06/23  0548     History  Chief Complaint  Patient presents with   Migraine    Tiffany Daugherty is a 42 y.o. female.  42 year old female presents today for concern of migraine headache.  She has family at bedside.  She prefers family interprets for her as opposed to an interpreter.  Symptoms ongoing for the past 3 to 4 months.  They are intermittent.  No vision change.  She does have headaches but they are not typically this bad.  She has taken over-the-counter medicines without significant improvement.  She does have a PCP that she sees.  Has not been evaluated for headaches up until this point.  The history is provided by the patient. No language interpreter was used.       Home Medications Prior to Admission medications   Medication Sig Start Date End Date Taking? Authorizing Provider  naproxen sodium (ANAPROX) 550 MG tablet Take 1 tablet (550 mg total) by mouth 2 (two) times daily with a meal. Take starting two days before her period and during her period 01/11/20   Anyanwu, Jethro Bastos, MD      Allergies    Patient has no known allergies.    Review of Systems   Review of Systems  Constitutional:  Negative for chills and fever.  Eyes:  Negative for visual disturbance.  Neurological:  Positive for headaches. Negative for syncope and light-headedness.  All other systems reviewed and are negative.   Physical Exam Updated Vital Signs BP 116/79   Pulse 69   Temp 97.9 F (36.6 C) (Oral)   Resp 17   SpO2 100%  Physical Exam Vitals and nursing note reviewed.  Constitutional:      General: She is not in acute distress.    Appearance: Normal appearance. She is not ill-appearing.  HENT:     Head: Normocephalic and atraumatic.     Nose: Nose normal.  Eyes:     Conjunctiva/sclera: Conjunctivae normal.  Cardiovascular:     Rate and Rhythm: Normal rate and  regular rhythm.  Pulmonary:     Effort: Pulmonary effort is normal. No respiratory distress.     Breath sounds: Normal breath sounds. No wheezing.  Abdominal:     General: There is no distension.     Palpations: Abdomen is soft.     Tenderness: There is no abdominal tenderness.  Musculoskeletal:        General: No deformity. Normal range of motion.     Cervical back: Normal range of motion.  Skin:    Findings: No rash.  Neurological:     General: No focal deficit present.     Mental Status: She is alert and oriented to person, place, and time.     Cranial Nerves: No cranial nerve deficit.     Motor: No weakness.     ED Results / Procedures / Treatments   Labs (all labs ordered are listed, but only abnormal results are displayed) Labs Reviewed - No data to display  EKG None  Radiology CT Head Wo Contrast Result Date: 05/06/2023 CLINICAL DATA:  Headache, new onset. EXAM: CT HEAD WITHOUT CONTRAST TECHNIQUE: Contiguous axial images were obtained from the base of the skull through the vertex without intravenous contrast. RADIATION DOSE REDUCTION: This exam was performed according to the departmental dose-optimization program which includes automated exposure control, adjustment of the mA and/or kV according to  patient size and/or use of iterative reconstruction technique. COMPARISON:  None Available. FINDINGS: Brain: No evidence of acute infarction, hemorrhage, hydrocephalus, extra-axial collection or mass lesion/mass effect. Vascular: No hyperdense vessel or unexpected calcification. Skull: Normal. Negative for fracture or focal lesion. Sinuses/Orbits: No acute finding. IMPRESSION: Negative head CT. Electronically Signed   By: Tiburcio Pea M.D.   On: 05/06/2023 07:47    Procedures Procedures    Medications Ordered in ED Medications  ketorolac (TORADOL) 15 MG/ML injection 15 mg (15 mg Intravenous Given 05/06/23 0801)  prochlorperazine (COMPAZINE) injection 10 mg (10 mg  Intravenous Given 05/06/23 0800)  diphenhydrAMINE (BENADRYL) injection 12.5 mg (12.5 mg Intravenous Given 05/06/23 0801)    ED Course/ Medical Decision Making/ A&P                                 Medical Decision Making Amount and/or Complexity of Data Reviewed Radiology: ordered.  Risk Prescription drug management.   Medical Decision Making / ED Course   This patient presents to the ED for concern of headache, this involves an extensive number of treatment options, and is a complaint that carries with it a high risk of complications and morbidity.  The differential diagnosis includes migraine headache, tension headache, cluster headache  MDM: 42 year old female presents today for concern of headache.  Ongoing for the past 3 to 4 months.  Intermittent.  No associated vision change, neck pain, or fever.  No suspicion for meningitis.  Will give migraine cocktail, obtain CT head will reevaluate.  Improved on reevaluation.  Return precaution discussed.  Patient is stable for discharge.  Discharged in stable condition.  Patient and sister voiced understanding and are in agreement with plan.  Lab Tests: -I ordered, reviewed, and interpreted labs.   The pertinent results include:   Labs Reviewed - No data to display    EKG  EKG Interpretation Date/Time:    Ventricular Rate:    PR Interval:    QRS Duration:    QT Interval:    QTC Calculation:   R Axis:      Text Interpretation:           Imaging Studies ordered: I ordered imaging studies including CT head I independently visualized and interpreted imaging. I agree with the radiologist interpretation   Medicines ordered and prescription drug management: Meds ordered this encounter  Medications   ketorolac (TORADOL) 15 MG/ML injection 15 mg   prochlorperazine (COMPAZINE) injection 10 mg   diphenhydrAMINE (BENADRYL) injection 12.5 mg    -I have reviewed the patients home medicines and have made adjustments as  needed   Reevaluation: After the interventions noted above, I reevaluated the patient and found that they have :improved  Co morbidities that complicate the patient evaluation  Past Medical History:  Diagnosis Date   Uterine cyst       Dispostion: Discharged in stable condition.  Return precaution discussed.  Patient voices understanding and is in agreement with plan.   Final Clinical Impression(s) / ED Diagnoses Final diagnoses:  Bad headache    Rx / DC Orders ED Discharge Orders     None         Marita Kansas, PA-C 05/06/23 1024    Ernie Avena, MD 05/06/23 1317

## 2023-05-06 NOTE — ED Triage Notes (Signed)
Pt reports headache since last night & reports she has tried taking home medications w/ no relief. Also reports nausea.

## 2024-05-22 ENCOUNTER — Encounter (HOSPITAL_COMMUNITY): Payer: Self-pay

## 2024-05-22 ENCOUNTER — Ambulatory Visit (HOSPITAL_COMMUNITY)
Admission: EM | Admit: 2024-05-22 | Discharge: 2024-05-22 | Disposition: A | Discharge status: Home / Self Care | Payer: Self-pay

## 2024-05-22 DIAGNOSIS — K611 Rectal abscess: Secondary | ICD-10-CM

## 2024-05-22 MED ORDER — DOXYCYCLINE HYCLATE 100 MG PO CAPS: 100.0000 mg | ORAL_CAPSULE | Freq: Two times a day (BID) | ORAL | 0 refills | Status: AC

## 2024-05-22 MED ORDER — DICLOFENAC SODIUM 50 MG PO TBEC: 50.0000 mg | DELAYED_RELEASE_TABLET | Freq: Two times a day (BID) | ORAL | 1 refills | Status: AC

## 2024-05-22 NOTE — Discharge Instructions (Signed)
" °  1. Perirectal abscess (Primary) - doxycycline  (VIBRAMYCIN ) 100 MG capsule; Take 1 capsule (100 mg total) by mouth 2 (two) times daily.  Dispense: 20 capsule; Refill: 0 - diclofenac  (VOLTAREN ) 50 MG EC tablet; Take 1 tablet (50 mg total) by mouth 2 (two) times daily.  Dispense: 30 tablet; Refill: 1 - Continue to utilize sitz bath's or warm shower to flush out any bacteria that may be within the abscess and for comfort.  -Continue to monitor symptoms for any change in severity if there is any escalation of current symptoms or development of new symptoms follow-up in ER for further evaluation and management. "

## 2024-05-22 NOTE — ED Provider Notes (Signed)
 " [facility] [place]  Note:  This document was prepared using Dragon voice recognition software and may include unintentional dictation errors.  MRN: [id number] DOB: [birth year]  Subjective:   Tiffany Daugherty is a 44 y.o. female presenting for evaluation of pain and swelling along her upper buttocks for the last week.  Patient reports that she started having some swelling and redness in the area and then on Tuesday pain became worse.  Patient reports that yesterday evening the area started draining foul-smelling fluid.  Patient denies that the drainage was thick or had a severe odor.  Patient has been using over-the-counter hemorrhoid cream due to her past history of hemorrhoids.  Patient reports some ibuprofen  for pain with minimal improvement patient has been sitting in the bathtub with warm water and states that this does decrease the pain.  Patient denies any past history of rectal abscesses but states she does have a history of hemorrhoids.  Current Medications[1]   Allergies[2]  Past Medical History:  Diagnosis Date   Uterine cyst      Past Surgical History:  Procedure Laterality Date   HERNIA REPAIR      History reviewed. No pertinent family history.  Social History[3]  ROS Refer to HPI for ROS details.  Objective:    Vitals: BP (!) 130/90 (BP Location: Left Wrist)   Pulse 75   Temp 98.7 F (37.1 C) (Oral)   Resp 18   LMP 05/13/2024 (Approximate)   SpO2 98%   Physical Exam Vitals and nursing note reviewed. Exam conducted with a chaperone present.  Constitutional:      General: She is not in acute distress.    Appearance: Normal appearance. She is not ill-appearing.  HENT:     Head: Normocephalic.  Cardiovascular:     Rate and Rhythm: Normal rate.  Pulmonary:     Effort: Pulmonary effort is normal. No respiratory distress.     Breath sounds: No stridor. No wheezing.  Genitourinary:    Rectum: Tenderness (Perirectal abscess visualized on exam,  purulent bloody drainage visualized draining from abscess, moderate swelling, erythema, tenderness to palpation.) present. No anal fissure, external hemorrhoid or internal hemorrhoid.   Skin:    General: Skin is warm and dry.     Capillary Refill: Capillary refill takes less than 2 seconds.  Neurological:     General: No focal deficit present.     Mental Status: She is alert and oriented to person, place, and time.  Psychiatric:        Mood and Affect: Mood normal.        Behavior: Behavior normal.     Procedures  No results found for this or any previous visit (from the past 24 hours).  Assessment and Plan :     Discharge Instructions       1. Perirectal abscess (Primary) - doxycycline  (VIBRAMYCIN ) 100 MG capsule; Take 1 capsule (100 mg total) by mouth 2 (two) times daily.  Dispense: 20 capsule; Refill: 0 - diclofenac  (VOLTAREN ) 50 MG EC tablet; Take 1 tablet (50 mg total) by mouth 2 (two) times daily.  Dispense: 30 tablet; Refill: 1 - Continue to utilize sitz bath's or warm shower to flush out any bacteria that may be within the abscess and for comfort.  -Continue to monitor symptoms for any change in severity if there is any escalation of current symptoms or development of new symptoms follow-up in ER for further evaluation and management.      Jacolby Risby B Rodina Pinales    [  1] No current facility-administered medications for this encounter.  Current Outpatient Medications:    diclofenac  (VOLTAREN ) 50 MG EC tablet, Take 1 tablet (50 mg total) by mouth 2 (two) times daily., Disp: 30 tablet, Rfl: 1   doxycycline  (VIBRAMYCIN ) 100 MG capsule, Take 1 capsule (100 mg total) by mouth 2 (two) times daily., Disp: 20 capsule, Rfl: 0   naproxen  sodium (ANAPROX ) 550 MG tablet, Take 1 tablet (550 mg total) by mouth 2 (two) times daily with a meal. Take starting two days before her period and during her period, Disp: 60 tablet, Rfl: 5   POLY-IRON 150 FORTE 150-25-1 MG-MCG-MG CAPS, Take 1  capsule by mouth daily., Disp: , Rfl:  [2] No Known Allergies [3]  Social History Tobacco Use   Smoking status: Never   Smokeless tobacco: Never  Substance Use Topics   Alcohol use: Yes   Drug use: No     Aurea Goodell B, NP 05/22/24 1711  "

## 2024-05-22 NOTE — ED Triage Notes (Signed)
 Interpreter: LAMAR [phone]  Patient has a rash along the upper buttocks noticed this past Sunday. States it got worse this past Tuesday. States has a history of hemorrhoids.   Patient tried ibuprofen  and a prescription ointment with mild relief.
